# Patient Record
Sex: Female | Born: 1945 | Race: Black or African American | Hispanic: No | Smoking: Never smoker
Health system: Southern US, Community
[De-identification: ages and names within clinical notes are randomized; demographics above are authoritative.]

## PROBLEM LIST (undated history)

## (undated) DIAGNOSIS — G43909 Migraine, unspecified, not intractable, without status migrainosus: Secondary | ICD-10-CM

## (undated) DIAGNOSIS — M1990 Unspecified osteoarthritis, unspecified site: Secondary | ICD-10-CM

## (undated) DIAGNOSIS — J189 Pneumonia, unspecified organism: Secondary | ICD-10-CM

## (undated) DIAGNOSIS — J45909 Unspecified asthma, uncomplicated: Secondary | ICD-10-CM

## (undated) DIAGNOSIS — I509 Heart failure, unspecified: Secondary | ICD-10-CM

## (undated) DIAGNOSIS — I10 Essential (primary) hypertension: Secondary | ICD-10-CM

## (undated) DIAGNOSIS — M797 Fibromyalgia: Secondary | ICD-10-CM

## (undated) HISTORY — DX: Migraine, unspecified, not intractable, without status migrainosus: G43.909

## (undated) HISTORY — DX: Pneumonia, unspecified organism: J18.9

## (undated) HISTORY — DX: Unspecified osteoarthritis, unspecified site: M19.90

---

## 1968-11-06 HISTORY — PX: HERNIA REPAIR: SHX51

## 1998-07-14 ENCOUNTER — Emergency Department (HOSPITAL_COMMUNITY): Admission: EM | Admit: 1998-07-14 | Discharge: 1998-07-14 | Payer: Self-pay | Admitting: Family Medicine

## 1998-09-02 ENCOUNTER — Emergency Department (HOSPITAL_COMMUNITY): Admission: EM | Admit: 1998-09-02 | Discharge: 1998-09-02 | Payer: Self-pay | Admitting: Emergency Medicine

## 1998-09-04 ENCOUNTER — Emergency Department (HOSPITAL_COMMUNITY): Admission: EM | Admit: 1998-09-04 | Discharge: 1998-09-04 | Payer: Self-pay | Admitting: Emergency Medicine

## 1999-01-29 ENCOUNTER — Emergency Department (HOSPITAL_COMMUNITY): Admission: EM | Admit: 1999-01-29 | Discharge: 1999-01-30 | Payer: Self-pay | Admitting: Emergency Medicine

## 1999-01-30 ENCOUNTER — Encounter: Payer: Self-pay | Admitting: Emergency Medicine

## 1999-04-03 ENCOUNTER — Emergency Department (HOSPITAL_COMMUNITY): Admission: EM | Admit: 1999-04-03 | Discharge: 1999-04-03 | Payer: Self-pay | Admitting: Emergency Medicine

## 1999-04-04 ENCOUNTER — Emergency Department (HOSPITAL_COMMUNITY): Admission: EM | Admit: 1999-04-04 | Discharge: 1999-04-04 | Payer: Self-pay | Admitting: Emergency Medicine

## 2000-05-31 ENCOUNTER — Emergency Department (HOSPITAL_COMMUNITY): Admission: EM | Admit: 2000-05-31 | Discharge: 2000-05-31 | Payer: Self-pay | Admitting: Emergency Medicine

## 2000-05-31 ENCOUNTER — Encounter: Payer: Self-pay | Admitting: Emergency Medicine

## 2000-08-01 ENCOUNTER — Emergency Department (HOSPITAL_COMMUNITY): Admission: EM | Admit: 2000-08-01 | Discharge: 2000-08-01 | Payer: Self-pay | Admitting: Emergency Medicine

## 2000-11-27 ENCOUNTER — Emergency Department (HOSPITAL_COMMUNITY): Admission: EM | Admit: 2000-11-27 | Discharge: 2000-11-27 | Payer: Self-pay | Admitting: Emergency Medicine

## 2000-11-27 ENCOUNTER — Encounter: Payer: Self-pay | Admitting: Emergency Medicine

## 2003-04-22 ENCOUNTER — Encounter: Admission: RE | Admit: 2003-04-22 | Discharge: 2003-04-22 | Payer: Self-pay | Admitting: Internal Medicine

## 2003-04-22 ENCOUNTER — Encounter: Payer: Self-pay | Admitting: Internal Medicine

## 2004-04-19 ENCOUNTER — Encounter: Admission: RE | Admit: 2004-04-19 | Discharge: 2004-04-19 | Payer: Self-pay | Admitting: Internal Medicine

## 2004-11-21 ENCOUNTER — Ambulatory Visit (HOSPITAL_COMMUNITY): Admission: RE | Admit: 2004-11-21 | Discharge: 2004-11-21 | Payer: Self-pay | Admitting: Otolaryngology

## 2004-11-21 ENCOUNTER — Encounter (INDEPENDENT_AMBULATORY_CARE_PROVIDER_SITE_OTHER): Payer: Self-pay | Admitting: *Deleted

## 2004-11-21 ENCOUNTER — Ambulatory Visit (HOSPITAL_BASED_OUTPATIENT_CLINIC_OR_DEPARTMENT_OTHER): Admission: RE | Admit: 2004-11-21 | Discharge: 2004-11-21 | Payer: Self-pay | Admitting: Otolaryngology

## 2006-01-03 ENCOUNTER — Encounter: Admission: RE | Admit: 2006-01-03 | Discharge: 2006-01-03 | Payer: Self-pay | Admitting: Internal Medicine

## 2006-11-09 ENCOUNTER — Ambulatory Visit (HOSPITAL_COMMUNITY): Admission: RE | Admit: 2006-11-09 | Discharge: 2006-11-09 | Payer: Self-pay | Admitting: Internal Medicine

## 2006-11-14 ENCOUNTER — Encounter (INDEPENDENT_AMBULATORY_CARE_PROVIDER_SITE_OTHER): Payer: Self-pay | Admitting: Specialist

## 2006-11-14 ENCOUNTER — Ambulatory Visit (HOSPITAL_COMMUNITY): Admission: RE | Admit: 2006-11-14 | Discharge: 2006-11-15 | Payer: Self-pay | Admitting: Otolaryngology

## 2007-08-13 ENCOUNTER — Emergency Department (HOSPITAL_COMMUNITY): Admission: EM | Admit: 2007-08-13 | Discharge: 2007-08-13 | Payer: Self-pay | Admitting: Emergency Medicine

## 2008-01-28 ENCOUNTER — Ambulatory Visit: Payer: Self-pay | Admitting: Internal Medicine

## 2008-01-28 ENCOUNTER — Inpatient Hospital Stay (HOSPITAL_COMMUNITY): Admission: AD | Admit: 2008-01-28 | Discharge: 2008-02-01 | Payer: Self-pay | Admitting: Internal Medicine

## 2008-01-30 ENCOUNTER — Encounter (INDEPENDENT_AMBULATORY_CARE_PROVIDER_SITE_OTHER): Payer: Self-pay | Admitting: Internal Medicine

## 2008-02-06 DIAGNOSIS — I10 Essential (primary) hypertension: Secondary | ICD-10-CM | POA: Insufficient documentation

## 2008-04-29 ENCOUNTER — Encounter: Payer: Self-pay | Admitting: Internal Medicine

## 2008-04-29 ENCOUNTER — Ambulatory Visit: Payer: Self-pay | Admitting: Internal Medicine

## 2008-04-29 DIAGNOSIS — J8409 Other alveolar and parieto-alveolar conditions: Secondary | ICD-10-CM | POA: Insufficient documentation

## 2008-05-01 ENCOUNTER — Ambulatory Visit: Payer: Self-pay | Admitting: Internal Medicine

## 2008-05-01 ENCOUNTER — Ambulatory Visit: Payer: Self-pay | Admitting: Cardiology

## 2008-05-01 LAB — CONVERTED CEMR LAB
Basophils Absolute: 0.1 10*3/uL (ref 0.0–0.1)
Basophils Relative: 1 % (ref 0–1)
Calcium: 8.6 mg/dL (ref 8.4–10.5)
Eosinophils Relative: 28 % — ABNORMAL HIGH (ref 0–5)
INR: 1.1 (ref 0.0–1.5)
MCHC: 30.3 g/dL (ref 30.0–36.0)
MCV: 94.9 fL (ref 78.0–100.0)
Neutrophils Relative %: 34 % — ABNORMAL LOW (ref 43–77)
Platelets: 816 10*3/uL — ABNORMAL HIGH (ref 150–400)
Prothrombin Time: 14.4 s (ref 11.6–15.2)
RBC: 3.76 M/uL — ABNORMAL LOW (ref 3.87–5.11)
Sodium: 138 meq/L (ref 135–145)
WBC: 9.4 10*3/uL (ref 4.0–10.5)
aPTT: 36 s (ref 24–37)

## 2008-05-05 ENCOUNTER — Telehealth: Payer: Self-pay | Admitting: Internal Medicine

## 2008-05-13 ENCOUNTER — Ambulatory Visit: Admission: RE | Admit: 2008-05-13 | Discharge: 2008-05-13 | Payer: Self-pay | Admitting: Internal Medicine

## 2008-05-13 ENCOUNTER — Ambulatory Visit: Payer: Self-pay | Admitting: Internal Medicine

## 2008-05-13 ENCOUNTER — Encounter: Payer: Self-pay | Admitting: Internal Medicine

## 2008-05-14 ENCOUNTER — Telehealth: Payer: Self-pay | Admitting: Internal Medicine

## 2008-05-19 ENCOUNTER — Ambulatory Visit: Payer: Self-pay | Admitting: Internal Medicine

## 2008-05-19 LAB — CONVERTED CEMR LAB
Rhuematoid fact SerPl-aCnc: <20 intl units/mL — ABNORMAL LOW (ref 0.0–20.0)
Sed Rate: 51 mm/hr — ABNORMAL HIGH (ref 0–22)

## 2008-05-25 ENCOUNTER — Ambulatory Visit: Payer: Self-pay | Admitting: Internal Medicine

## 2008-07-14 ENCOUNTER — Telehealth: Payer: Self-pay | Admitting: Internal Medicine

## 2008-08-12 ENCOUNTER — Ambulatory Visit: Payer: Self-pay | Admitting: Internal Medicine

## 2008-08-14 ENCOUNTER — Encounter: Payer: Self-pay | Admitting: Internal Medicine

## 2008-08-14 ENCOUNTER — Telehealth: Payer: Self-pay | Admitting: Internal Medicine

## 2008-09-28 ENCOUNTER — Observation Stay (HOSPITAL_COMMUNITY): Admission: AD | Admit: 2008-09-28 | Discharge: 2008-09-29 | Payer: Self-pay | Admitting: Internal Medicine

## 2008-09-28 ENCOUNTER — Telehealth: Payer: Self-pay | Admitting: Internal Medicine

## 2009-04-22 ENCOUNTER — Emergency Department (HOSPITAL_COMMUNITY): Admission: EM | Admit: 2009-04-22 | Discharge: 2009-04-22 | Payer: Self-pay | Admitting: Emergency Medicine

## 2010-11-27 ENCOUNTER — Encounter: Payer: Self-pay | Admitting: Internal Medicine

## 2011-01-26 ENCOUNTER — Emergency Department (HOSPITAL_COMMUNITY): Payer: Medicare Other

## 2011-01-26 ENCOUNTER — Inpatient Hospital Stay (HOSPITAL_COMMUNITY)
Admission: EM | Admit: 2011-01-26 | Discharge: 2011-01-30 | DRG: 811 | Disposition: A | Discharge status: Home Health | Payer: Medicare Other | Source: Ambulatory Visit | Attending: Internal Medicine | Admitting: Internal Medicine

## 2011-01-26 DIAGNOSIS — D371 Neoplasm of uncertain behavior of stomach: Secondary | ICD-10-CM | POA: Diagnosis present

## 2011-01-26 DIAGNOSIS — M1710 Unilateral primary osteoarthritis, unspecified knee: Secondary | ICD-10-CM | POA: Diagnosis present

## 2011-01-26 DIAGNOSIS — D375 Neoplasm of uncertain behavior of rectum: Secondary | ICD-10-CM | POA: Diagnosis present

## 2011-01-26 DIAGNOSIS — K219 Gastro-esophageal reflux disease without esophagitis: Secondary | ICD-10-CM | POA: Diagnosis present

## 2011-01-26 DIAGNOSIS — Q391 Atresia of esophagus with tracheo-esophageal fistula: Secondary | ICD-10-CM

## 2011-01-26 DIAGNOSIS — I10 Essential (primary) hypertension: Secondary | ICD-10-CM | POA: Diagnosis present

## 2011-01-26 DIAGNOSIS — E785 Hyperlipidemia, unspecified: Secondary | ICD-10-CM | POA: Diagnosis present

## 2011-01-26 DIAGNOSIS — J8289 Other pulmonary eosinophilia, not elsewhere classified: Secondary | ICD-10-CM | POA: Diagnosis present

## 2011-01-26 DIAGNOSIS — D500 Iron deficiency anemia secondary to blood loss (chronic): Principal | ICD-10-CM | POA: Diagnosis present

## 2011-01-26 DIAGNOSIS — J45909 Unspecified asthma, uncomplicated: Secondary | ICD-10-CM | POA: Diagnosis present

## 2011-01-26 DIAGNOSIS — Q393 Congenital stenosis and stricture of esophagus: Secondary | ICD-10-CM

## 2011-01-26 DIAGNOSIS — B3781 Candidal esophagitis: Secondary | ICD-10-CM | POA: Diagnosis present

## 2011-01-26 DIAGNOSIS — E669 Obesity, unspecified: Secondary | ICD-10-CM | POA: Diagnosis present

## 2011-01-26 DIAGNOSIS — K449 Diaphragmatic hernia without obstruction or gangrene: Secondary | ICD-10-CM | POA: Diagnosis present

## 2011-01-26 DIAGNOSIS — R1310 Dysphagia, unspecified: Secondary | ICD-10-CM | POA: Diagnosis present

## 2011-01-26 DIAGNOSIS — Z79899 Other long term (current) drug therapy: Secondary | ICD-10-CM

## 2011-01-26 LAB — COMPREHENSIVE METABOLIC PANEL
AST: 15 U/L (ref 0–37)
Chloride: 109 mEq/L (ref 96–112)
Creatinine, Ser: 0.84 mg/dL (ref 0.4–1.2)
GFR calc non Af Amer: >60 mL/min (ref >60)
Potassium: 3.8 mEq/L (ref 3.5–5.1)
Sodium: 135 mEq/L (ref 135–145)
Total Bilirubin: 0.4 mg/dL (ref 0.3–1.2)
Total Protein: 7.9 g/dL (ref 6.0–8.3)

## 2011-01-26 LAB — URINALYSIS, ROUTINE W REFLEX MICROSCOPIC
Bilirubin Urine: NEGATIVE
Ketones, ur: NEGATIVE mg/dL
Protein, ur: NEGATIVE mg/dL
Specific Gravity, Urine: 1.01 (ref 1.005–1.030)
Urobilinogen, UA: 0.2 mg/dL (ref 0.0–1.0)

## 2011-01-26 LAB — URINE MICROSCOPIC-ADD ON

## 2011-01-26 LAB — DIFFERENTIAL
Basophils Absolute: 0 10*3/uL (ref 0.0–0.1)
Basophils Relative: 0 % (ref 0–1)
Eosinophils Absolute: 0 10*3/uL (ref 0.0–0.7)
Lymphs Abs: 2 10*3/uL (ref 0.7–4.0)
Monocytes Relative: 9 % (ref 3–12)
Neutro Abs: 6.1 10*3/uL (ref 1.7–7.7)

## 2011-01-26 LAB — CBC
MCH: 18.1 pg — ABNORMAL LOW (ref 26.0–34.0)
WBC: 8.9 10*3/uL (ref 4.0–10.5)

## 2011-01-26 LAB — PROTIME-INR
INR: 1.14 (ref 0.00–1.49)
Prothrombin Time: 14.8 seconds (ref 11.6–15.2)

## 2011-01-27 DIAGNOSIS — D649 Anemia, unspecified: Secondary | ICD-10-CM

## 2011-01-27 DIAGNOSIS — R1110 Vomiting, unspecified: Secondary | ICD-10-CM

## 2011-01-27 LAB — CBC
HCT: 29.6 % — ABNORMAL LOW (ref 36.0–46.0)
MCH: 22.7 pg — ABNORMAL LOW (ref 26.0–34.0)
MCV: 75.5 fL — ABNORMAL LOW (ref 78.0–100.0)
Platelets: 411 10*3/uL — ABNORMAL HIGH (ref 150–400)
RDW: 19.5 % — ABNORMAL HIGH (ref 11.5–15.5)
WBC: 8.5 10*3/uL (ref 4.0–10.5)

## 2011-01-27 LAB — BASIC METABOLIC PANEL
BUN: 6 mg/dL (ref 6–23)
Chloride: 107 mEq/L (ref 96–112)
Creatinine, Ser: 0.68 mg/dL (ref 0.4–1.2)
GFR calc non Af Amer: >60 mL/min (ref >60)
Glucose, Bld: 88 mg/dL (ref 70–99)
Potassium: 3.6 mEq/L (ref 3.5–5.1)

## 2011-01-27 LAB — FERRITIN: Ferritin: 4 ng/mL — ABNORMAL LOW (ref 10–291)

## 2011-01-27 LAB — IRON AND TIBC: Iron: <10 ug/dL — ABNORMAL LOW (ref 42–135)

## 2011-01-27 LAB — VITAMIN B12: Vitamin B-12: 226 pg/mL (ref 211–911)

## 2011-01-28 ENCOUNTER — Encounter: Payer: Self-pay | Admitting: Gastroenterology

## 2011-01-28 DIAGNOSIS — R1310 Dysphagia, unspecified: Secondary | ICD-10-CM

## 2011-01-28 DIAGNOSIS — B3781 Candidal esophagitis: Secondary | ICD-10-CM

## 2011-01-28 DIAGNOSIS — K449 Diaphragmatic hernia without obstruction or gangrene: Secondary | ICD-10-CM

## 2011-01-28 DIAGNOSIS — D509 Iron deficiency anemia, unspecified: Secondary | ICD-10-CM

## 2011-01-28 LAB — CROSSMATCH
Antibody Screen: NEGATIVE
Unit division: 0
Unit division: 0

## 2011-01-28 LAB — CBC
HCT: 29.2 % — ABNORMAL LOW (ref 36.0–46.0)
MCHC: 28.8 g/dL — ABNORMAL LOW (ref 30.0–36.0)
MCV: 76.8 fL — ABNORMAL LOW (ref 78.0–100.0)
Platelets: 389 10*3/uL (ref 150–400)
RDW: 19.9 % — ABNORMAL HIGH (ref 11.5–15.5)
WBC: 7.4 10*3/uL (ref 4.0–10.5)

## 2011-01-29 ENCOUNTER — Other Ambulatory Visit: Payer: Self-pay | Admitting: Gastroenterology

## 2011-01-29 DIAGNOSIS — D509 Iron deficiency anemia, unspecified: Secondary | ICD-10-CM

## 2011-01-29 DIAGNOSIS — D126 Benign neoplasm of colon, unspecified: Secondary | ICD-10-CM

## 2011-01-29 LAB — CBC
MCH: 22 pg — ABNORMAL LOW (ref 26.0–34.0)
MCV: 77.5 fL — ABNORMAL LOW (ref 78.0–100.0)
Platelets: 440 10*3/uL — ABNORMAL HIGH (ref 150–400)
RDW: 20.3 % — ABNORMAL HIGH (ref 11.5–15.5)

## 2011-01-30 LAB — TISSUE TRANSGLUTAMINASE, IGA: Tissue Transglutaminase Ab, IgA: 6.2 U/mL (ref <20)

## 2011-01-31 NOTE — H&P (Signed)
Shirley Schneider, Shirley Schneider              ACCOUNT NO.:  [id number]  MEDICAL RECORD NO.:  [id number]           PATIENT TYPE:  LOCATION:                                 FACILITY:  PHYSICIAN:  Jeoffrey Massed, MD    DATE OF BIRTH:  1946/09/03  DATE OF ADMISSION:  01/26/2011 DATE OF DISCHARGE:                             HISTORY & PHYSICAL   PRIMARY CARE PRACTITIONER:  Fleet Contras, MD.  CHIEF COMPLAINT:  Refer to the ED from her primary care's office for possible anemia.  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  Per patient for the past 1 month, she has been having epigastric pain, burning in nature.  She thinks this is a flare of usual gastroesophageal reflux disease.  She also has some regurgitation and along with that apparently has been vomiting at least daily on a regular basis for a month.  She denies any hematemesis, hematochezia, or melena. She also claims to have worsening fatigue and malaise, and apparently has an unquantified amount of exertional dyspnea.  This patient has chronic osteoarthritis of her knee and shoulders, but the most worse is on her left knee.  She was ambulatory to the week ago with the help of a walker; however, for the past week or 10 days, the patient has been having trouble ambulating especially on the left leg because of the pain in the knee.  In the past, the patient has had numerous visits to her primary orthopedic office for intra-articular steroid injections.  She denies any headache, chest pain.  Denies any palpitations.  Denies any ongoing nausea or vomiting currently.  Denies any diarrhea.  Upon repeatedly asking, she denies that her  stool has ever been black in color or any blood in the stools that she has noticed.  Denies any dysuria.  ALLERGIES:  She claims she is allergic to ASPIRIN.  PAST MEDICAL HISTORY: 1. Hypertension. 2. Obesity. 3. Osteoarthritis. 4. Chronic eosinophilic pneumonia, diagnosed in 2009, and started on     prednisone which she was taken off for the past few months, she is     not exactly sure. 5. History of asthma. 6. Questionable dyslipidemia.  PAST SURGICAL HISTORY:  She has had a hernia surgery.  MEDICATIONS AT HOME: 1. Toprol-XL 50 mg p.o. daily. 2. She takes Soma at an unknown dose. 3. She takes Zanaflex at an unknown dose. 4. She takes some hydrocodone for osteoarthritis at an unknown dose.  FAMILY HISTORY:  Her mother has history of CVA.  SOCIAL HISTORY:  She denies any toxic habits and lives with her daughter.  REVIEW OF SYSTEMS:  A detailed review of 12 systems was done and these are negative, except for the ones mentioned in the HPI.  PHYSICAL EXAM:  VITAL SIGNS: Afebrile, heart rate of 65, blood pressure 120/61, respiration of 20, pulse ox of 100%  on room air. GENERAL:  Lying in bed, does not appear to be in any distress.  Easily able to speak in full sentences and participating in the history taking process.  Alert and awake. HEENT:  Atraumatic, normocephalic.  Pupils are equally reactive to light and accommodation.  Oral mucosa is moist. NECK:  Supple.  No JVD. CHEST:  Bilaterally clear to auscultation. CARDIOVASCULAR:  Heart sounds are regular.  No murmurs heard. ABDOMEN:  There is an upper midline scar.  The rest of her belly is soft, nontender, nondistended.  She is obese. EXTREMITIES:  There is no edema.  Her bilateral knees are not swollen or erythematous.  Her left knee has some mild tenderness on palpation. Again, it is not swollen on exam.  She has strong bilateral pedal pulses with good capillary refill and both her lower extremities are warm  to touch. SKIN:  No rash. NEUROLOGIC:  The patient is alert, awake, and has no focal neurological deficits.  LABORATORY DATA: 1. CBC shows a WBC of 8.9, hemoglobin of 5.4, hematocrit of 21.3, MCV     of 71.5, and a platelet count of 508. 2. Electrolytes show a sodium of 135, potassium of 3.8, chloride of     109, bicarb of 21, glucose of 87, BUN of 9, creatinine of 0.84. 3. Alkaline phosphatase is 213, total bilirubin is 0.4, AST is 15, ALT     is 9, calcium of 8.7, total protein of 7.9, and albumin of 4.4. 4. INR is 1.14.  RADIOLOGICAL STUDIES:  Acute abdominal series with abdominal two views and chest one view shows no acute abnormality of the chest or abdomen. Small hiatal hernia.  Low lung volumes.  ASSESSMENT: 1. Severe anemia likely microcytic.  The patient does not remember if     she has ever had a GI workup in the past.  Given the microcytic     anemia, obviously iron-deficiency anemia is of concern.  She will     likely need GI workup. 2. History of hypertension, currently well controlled. 3. History of osteoarthritis, particularly worse in her left knee.  We     will get x-rays to further evaluate and she may further in need of     Orthopedic assessment including for an intra-articular steroid     injection as she has difficulty ambulating with that leg. 4. History of chronic eosinophilic pneumonia, previously was on     steroid.  The patient is not sure when she stopped her steroid, but     claims this was a few months ago.  PLAN: 1. This patient will be admitted to telemetry unit. 2. She will be transfused with 3 units of blood after anemia panel has     been obtained. 3. She will be put on a proton pump inhibitor twice daily and given     antiemetics. 4. She will be given clear liquid diet for tonight, and after 7 a.m.,     she will be put n.p.o. for possible endoscopy.  I have spoken to     Dr. Leone Payor, [facility], and they will evaluate in      the morning as well. 5. Please note that the ED physician did do a rectal exam.  However,     he did not get any stool as the rectal vault was empty. 6. The patient will be observed and monitored very closely.     Currently, there are no signs or symptoms for history or an exam  of     any acute GI loss and we will continue to monitor closely.  Further     plans will be determined as the patient's clinical course evolves     and further sub-specialization input is obtained. 7. DVT prophylaxis will be done by bilateral SCDs.  We will avoid     heparin products given the degree of anemia and possibility of     occult bleeding. 8. We will obtain x-rays of her left hip, and if she continues to have     difficulty ambulating with the left leg, may be an Orthopedic     consultation will be warranted. 9. Physical therapy evaluation will also be obtained.  CODE STATUS:  The patient is a full code.  TOTAL TIME SPENT:  45 minutes.     Jeoffrey Massed, MD     SG/MEDQ  D:  01/26/2011  T:  01/26/2011  Job:  [id number]  cc:   Fleet Contras, M.D.  Electronically Signed by Jeoffrey Massed  on 01/31/2011 05:04:12 PM

## 2011-02-01 ENCOUNTER — Encounter: Payer: Self-pay | Admitting: Gastroenterology

## 2011-02-02 NOTE — Procedures (Signed)
Summary: Colonoscopy  Patient: [name] Note: All result statuses are Final unless otherwise noted.  Tests: (1) Colonoscopy (COL)   COL Colonoscopy           DONE     [place]     [address]     [place], [place]  [address]          COLONOSCOPY PROCEDURE REPORT          PATIENT:  Shirley Schneider, Shirley Schneider  MR#:  [id number]     BIRTHDATE:  09/22/1946, 65 yrs. old  GENDER:  female     ENDOSCOPIST:  Rachael Fee, MD     PROCEDURE DATE:  01/28/2011     PROCEDURE:  Incomplete colonoscopy     ASA CLASS:  Class II     INDICATIONS:  severe IDA, FOBT negative     MEDICATIONS:  Fentanyl 100 mcg IV, Versed 8 mg IV          DESCRIPTION OF PROCEDURE:   After the risks benefits and     alternatives of the procedure were thoroughly explained, informed     consent was obtained.  Digital rectal exam was performed and     revealed no rectal masses.   The EC-3890Li ([id number]) endoscope was     introduced through the anus and advanced to the sigmoid colon,     limited by poor preparation.    The quality of the prep was poor,     using MoviPrep.  The instrument was then slowly withdrawn as the     colon was fully examined.     <<PROCEDUREIMAGES>>     FINDINGS:  The prep was not adequate to allow appropriate     inspection of the mucosa. see image002, image003, and image004).     Retroflexed views in the rectum revealed not done.    The scope was     then withdrawn from the patient and the procedure     completed.COMPLICATIONS:  None          ENDOSCOPIC IMPRESSION:     1) Extremely poor prep.  Examination was incomplete due to poor     prep          RECOMMENDATIONS:     Will re-try prep today. It does not appear that she complete any     of the prep from the examination today. Will need staff to help     her this time.     Will proceed with EGD now.          ______________________________     Rachael Fee, MD          n.     eSIGNED:   Rachael Fee  at 01/28/2011 09:03 AM          [name], [id number]  Note: An exclamation mark (!) indicates a result that was not dispersed into the flowsheet. Document Creation Date: 01/28/2011 9:04 AM _______________________________________________________________________  (1) Order result status: Final Collection or observation date-time: 01/28/2011 08:54 Requested date-time:  Receipt date-time:  Reported date-time:  Referring Physician:   Ordering Physician: Rob Bunting [phone]) Specimen Source:  Source: Launa Grill Order Number: [id number] Lab site:

## 2011-02-02 NOTE — Procedures (Signed)
Summary: Upper Endoscopy  Patient: Shirley Schneider Note: All result statuses are Final unless otherwise noted.  Tests: (1) Upper Endoscopy (EGD)   EGD Upper Endoscopy       DONE     [facility]     [address]     [place], [place]  [address]          ENDOSCOPY PROCEDURE REPORT          PATIENT:  [name], [name]  MR#:  [id number]     BIRTHDATE:  August 25, 1946, 65 yrs. old  GENDER:  female     ENDOSCOPIST:  Rachael Fee, MD     PROCEDURE DATE:  01/28/2011     PROCEDURE:  EGD, diagnostic [phone]     ASA CLASS:  Class II     INDICATIONS:  severe IDA, regurge, dysphagia (colonoscopy just     prior to this exam was incomplete due to very poor prep)     MEDICATIONS:  There was residual sedation effect present from     prior procedure, Fentanyl 25 mcg IV, Versed 4.5 mg IV     TOPICAL ANESTHETIC:  Cetacaine Spray          DESCRIPTION OF PROCEDURE:   After the risks benefits and     alternatives of the procedure were thoroughly explained, informed     consent was obtained.  The Pentax Gastroscope [id number] endoscope     was introduced through the mouth and advanced to the second     portion of the duodenum, without limitations.  The instrument was     slowly withdrawn as the mucosa was fully examined.     <<PROCEDUREIMAGES>>     There were numerous yellow, whitish exudative plaques in     esophagus. This was consistent with fungal infection (see image001     and image010).  A Schatzki's ring was found. This was not dilated     (see image002).  A hiatal hernia was found. This was 4cm (see     image003 and image008).  Otherwise the examination was normal (see     image006, image005, and image007).    Retroflexed views revealed     no abnormalities.    The scope was then withdrawn from the patient     and the procedure completed.          COMPLICATIONS:  None          ENDOSCOPIC IMPRESSION:     1) Candida esophagitis     2) Schatzki's ring, not dilated     3)  Medium sized hiatal hernia     4) Otherwise normal examination          The UGI symptoms are multifactorial (medium sized hernia, yeast     infection in esophagus and Schatzki's ring).          RECOMMENDATIONS:     Will start diflucan 200mg  today, then 100mg  a day for nine more     days.     She should stay on once daily PPI.     If dysphagia persists despite antifungal treatment, will repeat     EGD and dilate the Schatzki's ring.  Should chew your food well     and eat slowly.          ______________________________     Rachael Fee, MD          n.     eSIGNED:   Rachael Fee  at 01/28/2011 09:11 AM          [name], [id number]  Note: An exclamation mark (!) indicates a result that was not dispersed into the flowsheet. Document Creation Date: 01/28/2011 9:12 AM _______________________________________________________________________  (1) Order result status: Final Collection or observation date-time: 01/28/2011 09:04 Requested date-time:  Receipt date-time:  Reported date-time:  Referring Physician:   Ordering Physician: Rob Bunting [phone]) Specimen Source:  Source: Launa Grill Order Number: [id number] Lab site:

## 2011-02-07 NOTE — Procedures (Signed)
Summary: Colonoscopy  Patient: Shirley Schneider Note: All result statuses are Final unless otherwise noted.  Tests: (1) Colonoscopy (COL)   COL Colonoscopy           DONE     [facility]     [address]     [place], [place]  [address]          COLONOSCOPY PROCEDURE REPORT          PATIENT:  Shirley, Schneider  MR#:  [id number]     BIRTHDATE:  1946/01/01, 65 yrs. old  GENDER:  female     ENDOSCOPIST:  Rachael Fee, MD     PROCEDURE DATE:  01/29/2011     PROCEDURE:  Colonoscopy with snare polypectomy     ASA CLASS:  Class II     INDICATIONS:  IDA, hemoccult negative     MEDICATIONS:   Versed 10 mg IV, Fentanyl 125 mcg IV, Benadryl 50     mg IV          DESCRIPTION OF PROCEDURE:   After the risks benefits and     alternatives of the procedure were thoroughly explained, informed     consent was obtained.  Digital rectal exam was performed and     revealed no rectal masses.   The EC-3890Li ([id number]) endoscope was     introduced through the anus and advanced to the cecum, which was     identified by both the appendix and ileocecal valve, without     limitations.  The quality of the prep was good, using MoviPrep.     The instrument was then slowly withdrawn as the colon was fully     examined.     <<PROCEDUREIMAGES>>     FINDINGS:  A sessile polyp was found in the descending colon. This     was 4mm across, removed with cold snare and sent to pathology (jar     1) (see image003).  This was otherwise a normal examination of the     colon (see image001, image002, and image004).   Retroflexed views     in the rectum revealed no abnormalities.    The scope was then     withdrawn from the patient and the procedure completed.     COMPLICATIONS:  None          ENDOSCOPIC IMPRESSION:     1) Small sessile polyp in the descending colon; removed and sent     to pathology     2) Otherwise normal examination; this examination does not     explain her severe IDA.  FOBT  has been negative twice and so it is     not clear that anemia is from a GI process.  Usual post     polypectomy bleeding may cause false positive FOB tests for next     1-2 weeks.          RECOMMENDATIONS:     1) If the polyp(s) removed today are proven to be adenomatous     (pre-cancerous) polyps, you will need a repeat colonoscopy in 5     years. Otherwise you should continue to follow colorectal cancer     screening guidelines for "routine risk" patients with colonoscopy     in 10 years.     2) You will receive a letter within 1-2 weeks with the results     of your biopsy as well as final recommendations. Please call my  office if you have not received a letter after 3 weeks.     3) Iron supplement daiy.          ______________________________     Rachael Fee, MD          n.     eSIGNED:   Rachael Fee at 01/29/2011 09:00 AM          [name], [id number]  Note: An exclamation mark (!) indicates a result that was not dispersed into the flowsheet. Document Creation Date: 01/29/2011 9:01 AM _______________________________________________________________________  (1) Order result status: Final Collection or observation date-time: 01/29/2011 08:55 Requested date-time:  Receipt date-time:  Reported date-time:  Referring Physician:   Ordering Physician: Rob Bunting [phone]) Specimen Source:  Source: Launa Grill Order Number: [id number] Lab site:

## 2011-02-22 NOTE — Discharge Summary (Signed)
Shirley Schneider, Shirley Schneider              ACCOUNT NO.:  [id number]  MEDICAL RECORD NO.:  [id number]           PATIENT TYPE:  I  LOCATION:  6733                         FACILITY:  [facility]  PHYSICIAN:  Clydia Llano, MD       DATE OF BIRTH:  1946/08/12  DATE OF ADMISSION:  01/26/2011 DATE OF DISCHARGE:  01/30/2011                              DISCHARGE SUMMARY   PRIMARY CARE PHYSICIAN:  Fleet Contras, MD  REASON FOR ADMISSION:  Directed from primary care physician because of anemia.  DISCHARGE DIAGNOSES: 1. Profound iron-deficiency anemia. 2. Esophageal candidiasis. 3. Hypertension. 4. Osteoarthritis. 5. Obesity. 6. Chronic eosinophilic pneumonia. 7. Asthma. 8. History of dyslipidemia. 9. Small one sessile polyp. 10.Schatzki ring.  DISCHARGE MEDICATIONS: 1. Fluconazole 100 mg p.o. daily, take for 8 more days. 2. Nu-Iron 150 mg p.o. at bedtime. 3. Clorazepate 7.5 mg twice daily as needed for anxiety. 4. Hydrocodone/APAP 10/500 mg 1 tablet p.o. every 6 hours needed for     pain. 5. Lidocaine patch 5% 1-2 patches transdermally q.24 hours as needed     for pain, apply to skin and leave for 12 hours, then remove. 6. Nexium 40 mg p.o. daily. 7. ProAir 90 mcg inhaler 1-2 puffs inhaled 4 times daily as needed for     wheezing or shortness of breath. 8. Promethazine 25 mg every 8 hours as needed for nausea. 9. Promethazine rectal suppository 25 mg rectally 3 times daily as     needed for nausea. 10.Soma 350 mg 1 tablet every 8 hours as needed for pain. 11.Symbicort 160/4.5 mcg 2 puffs inhaled twice daily. 12.Toprol-XL 50 mg p.o. daily. 13.Zanaflex 4 mg at bedtime as p.r.n. muscle relaxant.  RADIOLOGY: 1. Hip complete x-rays showed negative exam. 2. Acute abdomen series showed no acute abnormality of chest or     abdomen.  PROCEDURES: 1. EGD on March 24 showed:     a.     Candida esophagitis.     b.     Schatzki ring, not dilated.     c.     Medium-sized hiatal hernia, otherwise  normal examination. 2. Colonoscopy done on March 24 with bad preparation, repeated on     March 25.  The repeat exam showed small sessile polyp in the     descending colon, removed and sent to pathology, otherwise normal     examination.  This small polyp should not be probably causing the     anemia.  Pathology will be sent to the patient from Dr. Christella Hartigan'     office. 3. Blood transfusion of 3 units.  CONSULTS:  Dr. Rob Bunting, [facility].  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  For full H and P details, please see Dr. Windell Norfolk dictated note on March 22.  The patient apparently was complaining about recent epigastric pain, burning in nature and the patient denies any hematemesis, hematochezia, or melena.  She went to be evaluated by her primary care physician and she was found to be anemic, so  she was sent to the hospital for further evaluation.  The patient also has difficulty with ambulation because of osteoarthritis.  The patient is following with her orthopedist for intra- articular steroid injection.  Upon admission, the patient's hemoglobin was 5.4, so the patient was admitted for further evaluation. 1. Iron-deficiency anemia.  The patient has microcytic iron-deficiency     anemia, probably secondary to chronic iron deficiency and chronic     blood loss.  The patient did have negative fecal occult blood     testing when she came in but because of the profound anemia and the     symptoms, the patient underwent EGD and colonoscopy which were     within normal limits except for Candida esophagitis.  EGD and     colonoscopy showed no evidence of active source of bleeding.  The     patient has a small polyp which was removed which was not bleeding.     Dr. Christella Hartigan recommend to continue on the PPI daily.  Follow up  with     him if needed. 2. Iron deficiency.  The anemia panel showed iron of less than 10 and     ferritin less than 4.  The patient was given 1500 mg of elemental     iron through IV iron dextran.  Also, the patient was started on Nu-     Iron complex once daily at night time.  The patient said iron     supplements may cause upset of stomach, so she was asked to take it     at nighttime.  On the day of discharge, the patient hemoglobin was     9.1 and hematocrit 32.0. 3. Osteoarthritis and left lower extremity pain.  The patient was seen     by physical therapy and occupational therapy, was recommended home     health physical therapy and continues with the rolling walker.  The     patient does have orthopedist to follow up with. 4. Hypertension.  The patient's blood pressures are well controlled.     The patient is on amlodipine.  The patient's blood pressure was in     the high side.  When she came in, it was well controlled and then     started to go up a little bit probably because of the multiple     transfusion and IV fluids.  The patient is on Toprol-XL 50 mg p.o.     daily.  The patient required amlodipine during the hospital stay     but that was not prescribed at the time of discharge.  I     recommended to check with her primary care physician.  If blood     pressure is still high, she might benefit from high dose of     Norvasc.  DISCHARGE SITUATION:  The patient is alert.  DISCHARGE CONDITION:  Stable.  VITALS SIGNS:  Temperature is 98.5, pulse 73, respirations 19, blood pressure is 139/86, and O2 saturations 98% on room air.  INSTRUCTIONS: 1. Activity as tolerated. 2. Diet; heart-healthy diet.  DISPOSITION:  Home with home health service.     Clydia Llano, MD     ME/MEDQ  D:  01/30/2011  T:  01/31/2011  Job:  [id number]  cc:   Fleet Contras, M.D. Rachael Fee, MD  Electronically Signed by Clydia Llano  on 02/22/2011 03:40:19 PM

## 2011-03-21 NOTE — Op Note (Signed)
[name], Schneider              ACCOUNT NO.:  [id number]   MEDICAL RECORD NO.:  [id number]          PATIENT TYPE:  INP   LOCATION:  3028                         FACILITY:  [facility]   PHYSICIAN:  Kalman Shan, MD   DATE OF BIRTH:  09-Dec-1945   DATE OF PROCEDURE:  01/31/2008  DATE OF DISCHARGE:  02/01/2008                               OPERATIVE REPORT   PLANNED PROCEDURE:  Bronchoscopy with right middle lobe lavage and right  lower lobe transbronchial biopsy.   INDICATION:  Possible sarcoidosis.   PRE-PROCEDURE ASSESSMENT:  Shirley Schneider was taken to the bronchoscopy  suite this morning on January 31, 2008, for evaluation of possible  sarcoidosis. CT scan of the chest from [facility] showed subcarinal  lymphadenopathy that was significant in size, but when compared to the  CT scan from [facility] that was done in 2005 the CT scan this is  unchanged and stable subcarinal lymphadenopathy. However, current CT  scan shows worsening infiltrates.   She also had CT scan of the abdomen and pelvis. This was compared to the  prior CT scan of the abdomen and pelvis and showed stable  lymphadenopathy except for some new 1cm pelvic lymph nodes that Dr.  Claudie Schneider is going to follow.   As part of pre-procedure evaluation:  1. She had pulmonary function tests. FEV1 was 1.35 liters/60%. Normal      RAtio. Cleda Daub was mixed picture. DLCO was 13.87 and 57% of predicted      showing diffused diffusion capacity consistent with alveolar      filling defect.   1. A six-minute walk test:  Formal result is pending. She apparently      walked only 150 feet for over 2 minutes and stopped because of hip      pain. Her saturation dropped to 91%.   LABORATORY TESTS:  January 31, 2008, her phosphorus and magnesium were  normal. Cardiac panel on January 31, 2008 showed a normal troponin of  0.02.   BMET showed sodium 139, potassium 3.6, chloride 109, bicarb 20, glucose  87, BUN 5, creatinine 0.67,  calcium normal at 8.9.   LFTs were all within normal limits with an albumin of 3.   Coagulation PTT was 33% and normal. INR was 1.1 and normal.   Complete blood count showed white count of 69.7, hemoglobin 10 that is  stable since 2008, and platelet count of 718,000.   Infectious disease workup was urine legionella  antigen was negative.   Anemia workup showed normal folic acid, normal B12, and normal iron  panel results.   BNP  on January 30, 2008 was slightly elevated at 142.   Echocardiogram on January 30, 2008, showed left ventricular ejection  fraction of 70%. There is mild mitral valvular regurgitation. There were  no other abnormalities. The right ventricular function and size were  normal. Pulmonary artery pressures were felt to be normal.   Overall, she was felt to be stable and fit for bronchoscopy.   IMMEDIATE PRE-PROCEDURE ASSESSMENT:  In the bronchoscopy suite, she did  come on an empty stomach, but the blood pressure  was 170/115 on multiple  occasions. Her morning meds that were due at 10 o'clock were then  administered at 8 a.m. She was observed for a further 1 hour, and  despite this the blood pressure continued to be high at 170/115.  Therefore, bronchoscopy was cancelled. She was sent back to the floor.  She was again later evaluated around noon. Her blood pressure at this  time was 140/90. However, it was felt because of scheduling issues and  safety of bronchoscopy that bronchoscopy would be cancelled on January 31, 2008.   POST-PROCEDURE PLAN:  1. Cancelled bronchoscopy.  2. From my perspective she can go home.  3. Followup outpatient clinic with Dr. Marchelle Schneider on February 05, 2008, at      10:40 a.m. at [facility]. Telephone number and name and      address given.  4. Patient to bring her CD-ROM from [facility] done on January 28, 2008.  5. When she comes to followup we will make re-assessment about      bronchoscopy.   At bronchoscopy the plan  is to do right middle lobe lavage for CD8, CD4,  and D-dimer and cell count to help establish diagnosis of sarcoid and  also to do transbronchial biopsies of the right lower lobe and consider  a subcarinal needle aspiration.   NOTE: at time of my editing this dictation on 02/10/2008 patient did not  show up for followup. I called our scheduler Shirley Schneider and she did not  see patient in the system for an appointment. She is going to call  patient and make a new appt.      Kalman Shan, MD  Electronically Signed     MR/MEDQ  D:  01/31/2008  T:  01/31/2008  Job:  [id number]

## 2011-03-21 NOTE — Consult Note (Signed)
NAMECLARKE, AMBURN              ACCOUNT NO.:  [id number]   MEDICAL RECORD NO.:  [id number]          PATIENT TYPE:  INP   LOCATION:  4702                         FACILITY:  [facility]   PHYSICIAN:  Kalman Shan, MD   DATE OF BIRTH:  [birth year]   DATE OF CONSULTATION:  01/29/2008  DATE OF DISCHARGE:                                 CONSULTATION   PULMONARY CONSULTATION:  Forty minute inpatient consult between 12:05  and 12:45 p.m.   CONSULT REQUESTED BY:  Fleet Contras, M.D.   REASON FOR CONSULTATION:  Pulmonary infiltrates.  Please evaluate.   HISTORY OF PRESENT ILLNESS:  Kamorie Aldous is a pleasant, 65 year old  African American woman who is a primary care patient of Dr. Concepcion Elk for  hypertension, asthma, chronic sinusitis, and anxiety disorder.  She  reports to me that she has had a new onset cough for the past two weeks.  It is predominantly dry, although she feels that she has some phlegm  down her chest that she is unable to bring up.  It is present both  during day and night, worse when she lies down.  The frequency and  severity of cough is worsening with time.  In association with the  cough, she has had fevers and chills and sore throat for the past one  week.  The fever has been as high as 102 at home and intermittent in  nature.  She is also short of breath with exertion, activities such as  changing clothes and taking shower does make her short of breath  currently.   She denies orthopnea, paroxysmal nocturnal dyspnea, palpitations,  syncope, hemoptysis, vomiting, diarrhea or urinary symptoms.   She has had multiple visits to her Dr. Concepcion Elk with present complaints  and ultimately ended up having a chest x-ray and CT chest.  The CT chest  is not available, but I was told it has bilateral pulmonary infiltrates,  and therefore she was admitted for evaluation.   PAST MEDICAL HISTORY:  1. Systemic hypertension.  2. Gastroesophageal reflux disease under control  currently.  3. Asthma.  Asthma was diagnosed many years ago, is on Symbicort 2      puffs b.i.d. and ProAir as needed.  At baseline, it is under      control.  She requires ProAir only less than one time a week.  4. History of chronic bronchitis not otherwise specified.  5. Chronic sinusitis not otherwise specified.  6. History of multiple nasal surgeries.  Unclear if she is on nasal      steroids.  7. Nasal polyps.  8. Arthritis of lumbar spine.  9. Osteoporosis.  10.Anxiety disorder.  11.Dyslipidemia.   PAST SURGICAL HISTORY:  Has had sinus surgery twice.  Status post  abdominal hernia repair and tubal ligation.   HOME MEDICATIONS:  1. Zanaflex.  2. Albuterol.  3. Symbicort.  4. Tranxene.  5. Soma.  6. Phenergan.  7. Citrucel.  8. Actonel.  9. Bystolic.  10.Tussionex.  11.Allegra.  12.Pravastatin.  13.Bupap.   She is currently on Zosyn after admission.   ALLERGIES:  ASPIRIN WHICH CAUSES NAUSEA  AND VOMITING.   FAMILY HISTORY:  Father has diabetes, hypertension, stroke, thyroid  disease.  Mother is deceased.  She has children with asthma,  grandchildren with asthma.   Sick contact was one grandchild with pneumonia prior to her falling  sick.   SOCIAL HISTORY:  She is retired.  Does not use any alcohol, tobacco,  illicit drugs.   REVIEW OF SYSTEMS:  Significant for cough, fever, shortness of breath  and pulmonary infiltrates.  Significant sick contact recently.  Otherwise, see history of present illness.   PHYSICAL EXAMINATION:  VITAL SIGNS:  Temperature 98.8, pulse of 73,  respiratory rate of 20, blood pressure 147/92.  Saturation 98% on room  air.  GENERAL EXAM:  She is in an isolation room.  Seems comfortable, pleasant  female, nice to interact with.  Alert and oriented x3.  Glasgow coma  scale 15.  Moves all four extremities.  PSYCHIATRIC EXAM:  Pleasant affect.  GENERAL EXAM:  Morbidly obese.  HEENT:  Short neck, Mallampati class 2-3.  She was embarrassed  showing  her mouth because of thrush, but I did not notice any thrush.  Some  dental caries present.  Possibly elevated JVP at 2 to 3 cm in the  sitting position.  RESPIRATORY EXAM:  Trachea central.  Air entry equal on both sides.  Bilateral extensive wheeze present in expiratory phase.  In late  inspiration there are even some crackles.  CARDIOVASCULAR:  1st and 2nd heart sounds heard.  No murmurs, no  gallops.  ABDOMEN:  Obese, soft, nontender, no mass.  Bowel sounds are present.  EXTREMITIES:  No edema, no calf tenderness, no swelling.   LABORATORY EVALUATION:  1. CT scan of the chest.  This is with Dr. Concepcion Elk and he is going to      bring it in.  2. Chest x-ray January 28, 2008:  New bilateral patchy airspace disease      and nodular opacities.  May represent multifocal pneumonia, but      metastatic disease could not be excluded.  This is in comparison      with chest x-ray August 13, 2007.   Of note, she had a CT chest in April 19, 2004.  This was done for  generalized body pain and shortness of breath.  This showed multiple  small nodules less than 4 mm in size, and some lower lobe atelectasis  and slight nonspecific mediastinal adenopathy esp a 2cm subcarinal node.  There is no other subsequent CT chest.   LABORATORY VALUES:  1. LDH today, March 25, is normal at 134.  2. ABG room air:  Normal.  7.36, PCO2 - 34, PO2 - 84.  3. Urinalysis is negative.  4. BMET is essentially normal with BUN of 2 and a creatinine of 0.6.      Liver function tests are also normal.  5. CBC March 25:  White count 10.3, hemoglobin 10.1, platelet count      856.  Her platelet counts are abnormally elevated.  6. Serial hemoglobin shows a hemoglobin of 11.1 and 11.6 in October      and January 2008 respectively.  Therefore at this time, her      hemoglobin is overall stable.  MCV, mean corpuscular volume, is      normal, arguing against any microcytic anemia.   ASSESSMENT AND PLAN:   PROBLEM  LIST:  1. Normocytic anemia.  2. Diffuse parenchymal infiltrates.   Hue Steveson is a 65 year old nonsmoker with a past  medical history of  asthma, who was exposed to a grand kid with pneumonia prior to her  falling sick.  Fort the last two weeks has had progressive cough with  some fever, chills and shortness of breath.  Chest x-rays chows  bilateral diffuse parenchymal opacities.  CT scan is pending.  Of note,  CT scan in 2004 showed some micro nodules.  She also has normocytic  anemia of 10 grams at this current admission, but this seems stable  compared to her 2008 hemoglobin.  Differential diagnoses for parenchymal  infiltrates are broad and include carcinomatosis, active interstitial  pneumonitis (AIP), acute eosinophilic pneumonia (AEP), viral pneumonia,  apical pneumonia, congestive heart failure, and sarcoidosis.   PLAN:  1. I definitely want to see the CT chest done at [facility]  2. Stop Zosyn because she has had no hospitalizations.  Better drug      would be IV Avelox 400 mg once daily.  3. Check urine lesion.  Run streptococcal antigen.  4. Check RSV and FLU nasal antigen swab.  5. Agree with PPD placement.  6. Discontinue the respiratory isolation if the flu swab is negative.  7. Check BNP to rule out heart failure.   Depending on the he results of all of the above, I would consider  bronchoscopy to get at other etiologies.   Thank you for this interesting consult.      Kalman Shan, MD  Electronically Signed     MR/MEDQ  D:  01/29/2008  T:  01/29/2008  Job:  [id number]

## 2011-03-21 NOTE — H&P (Signed)
Shirley Schneider, Shirley Schneider              ACCOUNT NO.:  [id number]   MEDICAL RECORD NO.:  [id number]          PATIENT TYPE:  INP   LOCATION:  5530                         FACILITY:  [facility]   PHYSICIAN:  Fleet Contras, M.D.    DATE OF BIRTH:  [birth year]   DATE OF ADMISSION:  09/28/2008  DATE OF DISCHARGE:                              HISTORY & PHYSICAL   PRESENTING COMPLAINTS:  Cough with chest congestion.   HISTORY OF PRESENTING ILLNESS:  Shirley Schneider is a 65 year old African  American lady with past medical history significant for chronic  sinusitis, allergic rhinitis, chronic bronchitis, bronchial asthma,  systemic hypertension, and anxiety disorder.  She was directly admitted  from the office today after she complaints of 2 weeks of progressively  worsening cough, initially nonproductive but now productive of yellowish  sputum associated with feeling feverish, sweating, chills, nasal  congestion, and postnasal drip.  She had been treated about 2 weeks ago  for acute sinusitis with Zithromax, but her symptoms seem to have  recurred.  She is now feeling unwell, fatigue with weakness, and lack of  energy, as well as poor oral intake, and has no appetite to eat.  She  denies any chest pain but feels short of breath with mild exertion  associated with feeling of chest tightness and chest congestion.  She  has had no hemoptysis.  No orthopnea, PND, or palpitations.  In the  office her vital signs were stable.  She was not in any acute  respiratory or painful distress.  Her pulse source was 96% on room air.  She was not pale.  She was not icteric.  She was not cyanosed.  She had  mild congestion in her nasal passages.  She has scattered rhonchi in her  lungs.  Her chest x-ray did show a possible right middle lobe and right  lower lobe infiltrates.  In view of progressive recurrent symptoms, it  was thought to wise for her to be admitted to the hospital for  intravenous antibiotics and close  monitoring.   PAST MEDICAL HISTORY:  1. Recent hospitalization about 60-month ago for pneumonia.  2. Systemic hypertension.  3. Gastroesophageal reflux disease.  4. Bronchial asthma.  5. Chronic bronchitis.  6. Chronic sinusitis.  7. Arthritis of the lumbar spine.  8. Anxiety disorder.  9. Dyslipidemia.  10.Osteoporosis.   PAST SURGICAL HISTORY:  1. Sinus surgery x2.  2. Abdominal hernia repair.  3. Tubal ligation.   MEDICATION HISTORY:  She is on:  1. Zanaflex 4 mg nightly.  2. Albuterol HFA 2 puffs q.6 p.r.n.  3. Albuterol nebulizer 2.5 mg q.6 p.r.n.  4. Tranxene 7.5 mg b.i.d.  5. Soma 350 mg one p.o. t.i.d.  6. Phenergan 25 mg p.o. q.6 p.r.n.  7. Citracal plus vitamin D 1 p.o. b.i.d.  8. Prevacid 30 mg p.o. daily.  9. Allegra 180 mg p.o. daily.  10.Symbicort 160/4.5 mg 1 puff b.i.d.  11.Bystolic10 mg p.o. daily.  12.Toviaz 4 mg once daily.  13.Dolgic Plus 1 p.o. q.6 p.r.n.  14.Actonel 150 mg one p.o. monthly.   ALLERGIES:  She has  nausea and vomiting with ASPIRIN.   FAMILY AND SOCIAL HISTORY:  Both parents are deceased, mother from  hypertension and diabetes and father from heart disease, stroke, and  thyroid disease.  She has a child with asthma.  She does not use any  alcohol, tobacco, or illicit drugs.   REVIEW OF SYSTEMS:  Essentially as above.   PHYSICAL EXAMINATION:  She is sitting in the office chair, not in acute  respiratory or painful distress.  She is not pale.  She is not icteric.  She is not cyanosed.  She is well hydrated.  Her vital signs showed a  blood pressure of 140/84, heart rate of 81 and regular, respiratory rate  of 18, temperature 98.7, and pulse ox on room air is 96%.  She weighs in  at 229 pounds at the height of 5 feet 5 inches.  She is using two canes  to ambulate.  Her HEENT, pupils are equal and reactive to accommodation.  Her neck is supple with no elevated JVD.  No cervical lymphadenopathy.  No carotid bruit.  She has bilateral  nasal congestion with scanty  discharge, which is clear.  There are bilateral sinus, frontal, or  maxillary tenderness.  Chest shows good air entry bilaterally with few  transmitted sounds and rhonchi.  Heart sounds were not heard with no  murmurs, no S3 gallops, no clicks, or rubs.  Abdomen is obese, soft,  nontender, and no masses.  Bowel sounds are present.  Extremities shows  no edema, calf tenderness, or swelling.  CNS, she is alert and oriented  x2 with no focal neurological deficits.   ASSESSMENT:  Shirley Schneider is being admitted from the office with  suspected right middle lobe pneumonia, which is not responding to  outpatient therapy.   ADMISSION DIAGNOSES:  Bronchopneumonia, plan of care.  She will be  admitted to a medical bed.   Further laboratory data including CBC, CMET, chest x-ray, EKG,  urinalysis, and BNP were performed.  She will be started on IV Rocephin  1 g daily, IV half-normal saline at 35 mL an hour, and DVT prophylaxis  with subcu Lovenox 40 mg daily.  Home medication will be continued as  above.      Fleet Contras, M.D.  Electronically Signed     EA/MEDQ  D:  09/28/2008  T:  09/29/2008  Job:  [id number]

## 2011-03-21 NOTE — Op Note (Signed)
Shirley Schneider, Shirley Schneider              ACCOUNT NO.:  [id number]   MEDICAL RECORD NO.:  [id number]          PATIENT TYPE:  AMB   LOCATION:  CARD                         FACILITY:  [facility]   PHYSICIAN:  Kalman Shan, MD   DATE OF BIRTH:  1945/11/09   DATE OF PROCEDURE:  05/13/2008  DATE OF DISCHARGE:                               OPERATIVE REPORT   TYPE OF PROCEDURE:  Bronchoscopy with bronchoalveolar lavage,  transbronchial biopsies, subcarinal needle aspiration.   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   PREPROCEDURE ASSESSMENT:  The history and physical was less than 30 days  old.  Evaluation was done in the office on May 01, 2008.  ASA class was  class II.   On the day of procedure vital signs included height 65 inches, weight  180 pounds, blood pressure 145/86, pulse of 78, respiratory rate of 18.  ASA class II, airway assessment class I.   Exam was within normal limits except for old crackles in the chest.  Nil  by mouth was confirmed.  She also taken her antihypertensives on the  morning of procedure.   Laboratory values included hemoglobin of 10.8, platelet count 816,000,  INR of 1.1, PTT of 36 seconds, creatinine of 0.72, creatinine of 0.72 on  May 01, 2008.   The patient was acceptable for IV monitored sedation and could undergo  procedure.   PREPROCEDURE SEDATION:  Plan was monitored sedation with fentanyl and  Versed and topical lidocaine anesthesia.   PROCEDURE RECORD:  The large scope was introduced through the right  naris and 8:00 a.m.  A topical lidocaine was applied and vocal cords  looked normal.  The trachea was entered and looked normal.  The rings  looked sharp.  The carina looked sharp.  A detailed airway exam was done  on the right side.  This was normal.  Of note, there was no right upper  lobe apical segment.  There is only an anterior and posterior segments.  All the subsegments looked  normal.  There were no endobronchial lesions.  Attention was then turned to the left side.  The left main bronchus, the  left upper lobe takeoff, subsegments, lingula, left lower lobe  subsegments all looked normal.   Then a right middle lobe lavage was done using 20 mL of normal saline.  Returns from the first aliquot was discarded.  Subsequently three 20 mL  x normal saline lavages were done.  This was sent for analysis.  Following this, random biopsies were done and the transbronchial  biopsies were done in the right middle lobe, both medial and lateral  segments.  There was some oozing of heme (nonsignificant amounts) in the  right middle lobe lateral segment.  The right middle lobe was chosen for  biopsy after review of the radiology with Dr. Leanna Battles, thoracic  radiologist.   After confirmation of no abnormal bleeding, attention was turned to the  subcarinal region where there was a subcarinal node.  Dr. Fredirick Lathe had  felt that this node had enlarged compared to 2005.  Three subcarinal  needle aspirations were done with some minimal oozing of blood.  The  scope was then withdrawn at 8:22 a.m..   POSTPROCEDURE NOTES:  Fentanyl 100 mcg, Versed 8 mg and lidocaine 290 mL  was administered.  Fluoro time was 1 minute.  Estimated blood loss in  total was 2 to 3 mL.   COMPLICATIONS:  No complications.  The patient did complain of feeling  uneasy after bronch but vital signs were all normal.  There was no  wheezing on exam. Albuterol was administered post procedure as  precaution.  Chest x-ray post procedure showed no pneumothorax   POSTPROCEDURE PLAN:  1. Recovery room.  2. Await chest x-ray to rule out pneumothorax (later no pneumothorax)  3. She should call in 3 or 4 days for results.  4. Alveolar lavage being sent for CD-4, CV8, flow cytometry, cell      count, microbiology and cytology.  Transbronchial biopsies and      subcarinal needle being sent for histopathology and  cytology,      respectively.  5. Son, daughter updated post procedure      Kalman Shan, MD  Electronically Signed     MR/MEDQ  D:  05/13/2008  T:  05/13/2008  Job:  [id number]   cc:   Fleet Contras, M.D.  Fax: [phone]

## 2011-03-21 NOTE — H&P (Signed)
Shirley Schneider, Shirley Schneider              ACCOUNT NO.:  [id number]   MEDICAL RECORD NO.:  [id number]          PATIENT TYPE:  INP   LOCATION:  4702                         FACILITY:  [facility]   PHYSICIAN:  Fleet Contras, M.D.    DATE OF BIRTH:  Jul 05, 1946   DATE OF ADMISSION:  01/28/2008  DATE OF DISCHARGE:                              HISTORY & PHYSICAL   PRESENTING COMPLAINTS:  Cough and difficulty in breathing.   HISTORY OF PRESENT ILLNESS:  Shirley Schneider is a 65 year old African  American lady with past medical history significant for systemic  hypertension, bronchial asthma, chronic sinusitis, chronic bronchitis  and anxiety disorder.  She was admitted from home after she was seen in  the office a few days ago with 2 weeks history of cough, initially  nonproductive, associated with fevers, chills and sore throat.  She has  been feeling unwell and fatigued with weakness and lack of energy.  This  is associated with shortness of breath with mild exertion.  She denied  having any orthopnea, PND or palpitations, but does have some chest  discomfort when she coughs or takes a deep breath.  She had no abdominal  pain, vomiting or diarrhea.  She had no urinary symptoms.  Examination  in the office showed she was not in acute respiratory or painful  distress.  She was not dyspneic.  She had scattered rhonchi in her  lungs, and a chest x-ray did show infiltrate in her right lower zones,  and she was therefore started on oral antibiotic antibiotics with  Levaquin 100 mg daily.  She had a similar episode of pneumonia about  three months ago, and due to suspicion of possible mild lung disease, CT  scan of the chest was requested.  This was performed today at [facility].  It was reported as showing diffuse areas of consolidation and  infiltrates as well as mediastinal lymphadenopathy.  The patient was  called at home and advised to be admitted for further workup and  appropriate management.   PAST  MEDICAL HISTORY:  1. Systemic hypertension.  2. Gastroesophageal reflux disease.  3. Bronchial asthma.  4. Chronic bronchitis.  5. Chronic sinusitis.  6. Nasal polyps.  7. Arthritis of the lumbar spine.  8. Osteoporosis.  9. Anxiety disorder.  10.Dyslipidemia.   PAST SURGICAL HISTORY:  She has had sinus surgery twice.  She had an  abdominal hernia repair, tubal ligation.   MEDICATION HISTORY:  1. Zanaflex 4 mg q.h.s.  2. Albuterol HFA two puffs q.i.d. p.r.n.  3. Albuterol nebulized 2 puffs 5 mg q.6 h p.r.n.  4. Tranxene 7.5 mg b.i.d.  5. Soma 350 mg t.i.d.  6. Phenergan 25 mg t.i.d. p.r.n.  7. Citracal plus D one p.o. b.i.d.  8. Actonel 150 mg q. monthly.  9. Bystolic 10 mg daily.  10.Tussionex 5 ml b.i.d. p.r.n.  11.Symbicort 160/4.5 one puff q. 12.  12.Allegra 180 mg daily.  13.Prevacid 30 mg daily.  14.Bupap one p.o. b.i.d. p.r.n. for headaches.   ALLERGIES:  She is allergic to aspirin which causes nausea and vomiting.   FAMILY  HISTORY AND SOCIAL HISTORY:  Her father has history of diabetes,  heart disease, hypertension, stroke and thyroid disease.  Her mother is  deceased and had a history of hypertension and diabetes.  She has a  child with asthma.  She does not use any alcohol, tobacco or illicit  drugs.   REVIEW OF SYSTEMS:  Essentially as above.   PHYSICAL EXAMINATION:  GENERAL:  She is sitting on the chair, not in  acute respiratory or painful distress.  She is not pale.  She is not  icteric.  She is not cyanosed.  She is well-hydrated.  VITAL SIGNS:  Shows a blood pressure 140/90, heart rate of 86 regular,  respiratory rate of 16, temperature is 98.7 degrees Fahrenheit, O2 sats  on room air is 98%.  She weighs in at 199 pounds at a height of 5 feet 5  inches.  HEENT:  Oral mucosa shows white patches on her tongue and buccal mucosa  as well as her palate and pharynx.  NECK:  Supple with no elevated JVD.  No cervical lymphadenopathy and no  carotid bruits.   CHEST:  Shows good air entry bilaterally with scattered rhonchi and  expiratory wheezes.  HEART:  Sounds 1 and 2 are heard with no murmurs, no S3 gallops.  ABDOMEN:  Obese, soft, nontender, no masses.  Bowel sounds are present.  EXTREMITIES:  Shows no edema, no calf tenderness or swelling.  CNS:  She is alert and oriented x3 with no focal neurological deficits.   LABORATORY DATA:  Currently pending.   ASSESSMENT:  Shirley Schneider is a 65 year old African American lady admitted  directly from home with diagnosis of pneumonia which is partially  responding to outpatient therapy, but needs further evaluation in view  of recent findings on CT scan.   ADMISSION DIAGNOSIS:  1. Bilateral pneumonia.  2. Oral thrush.  3. Systemic hypertension.  4. Exacerbation of bronchial asthma.   PLAN OF CARE:  She will be admitted to a telemetry bed.  She will be on  2 grams sodium diet.  Strict I's and O's.  Activity as tolerated.  Vital  signs q.4 h.  Laboratory data would include CBC, C-met, urinalysis, EKG,  chest x-ray PA and lateral, ACE levels and check PPD.  She will be on IV  Zosyn 3.375 grams q.8 h, IV half normal saline at 75 mL an hour,  subacute Lovenox 40 mg daily.  Her home medication will be continued as  above.  She will likely  require abdominal CT scan to evaluate her progression and pulmonary  consult will be requested in the a.m.  She will be on oxygen via nasal  cannula to keep her O2 sats above 91%.  This plan of care has been  discussed with her and with her daughter and their questions answered.      Fleet Contras, M.D.  Electronically Signed     EA/MEDQ  D:  01/28/2008  T:  01/29/2008  Job:  [id number]

## 2011-03-24 NOTE — Op Note (Signed)
Shirley Schneider, Shirley Schneider              ACCOUNT NO.:  [id number]   MEDICAL RECORD NO.:  [id number]          PATIENT TYPE:  OIB   LOCATION:  5733                         FACILITY:  [facility]   PHYSICIAN:  Hermelinda Medicus, M.D.   DATE OF BIRTH:  01-30-1946   DATE OF PROCEDURE:  11/14/2006  DATE OF DISCHARGE:                               OPERATIVE REPORT   PREOPERATIVE DIAGNOSES:  Pansinusitis involving bilateral frontoethmoid,  right maxillary, left sphenoid, history of sinus surgery in 1970 and  2006, with history of chronic asthma and allergic bronchitis.   POSTOPERATIVE DIAGNOSES:  Pansinusitis involving bilateral  frontoethmoid, right maxillary, left sphenoid, history of sinus surgery  in 1970 and 2006, with history of chronic asthma and allergic  bronchitis.   OPERATION:  Functional endoscopic sinus surgery, bilateral  frontoethmoidectomy, right maxillary sinus ostial enlargement revision,  and a left sphenoidotomy, with removal of polyps.   OPERATOR:  Hermelinda Medicus, M.D.   ANESTHESIA:  General endotracheal anesthesia with Dr. Jacklynn Bue, with  local supplement, 1% Xylocaine with epinephrine.   The patient is aware of risks and gains, aware of the fact that her  orbital region is very close, the fact that the base of the skull is  very close to our procedure, that there are certain risks to this sinus  surgery.  And she is willing to accept these because she has had so many  sinus infections and so much pain.   PROCEDURE:  The patient was placed in supine position, and under general  endotracheal anesthesia, was anesthetized with 1% Xylocaine with  epinephrine.  Then, we outfractured the turbinates, and immediately we  found that the middle turbinates were pushed over laterally, attaching  themselves to the openings to the ethmoid sinuses, making the ethmoid  sinuses very difficult to drain.  We approached the left sphenoid sinus  first and found a narrower drainage site, but just  increased the size of  the sphenoid sinus opening.  On the left frontoethmoid, once we  medialized and removed a polyp from that left middle turbinate, we were  able to push the turbinate medially and gain some space, and then opened  the frontoethmoid by using 0- and 30-degree and 70-degree scopes, and  the upbiting Blakesley-Wilde forceps and straight Blakesley-Wilde.  Polyps and scar tissue were removed.  We could see up into the frontal  sinus now, suctioned the old mucopurulent debris and suctioned any  debris from this sinus.  Attention was then carried to the right  frontoethmoid region, where, again, we had to push the middle turbinate  medially to break the scar tissue.  We lysed those adhesions, entered  the frontoethmoid using the 0 and 70-degree scope, using the upbiting  and straight Blakesley-Wildes, and removed polyps and scar tissue from  this area and gained excellent access to the sinus.  The maxillary sinus  was again approached using the side-biting and backbiting forceps just  to increase the size of the natural ostium.  We could see up into the  frontal sinuses on both sides, up into the ethmoid on both sides, and  into  the maxillary.  We cleansed all the debris from this and we  suctioned mucus from this.  Gelfoam was then placed in the anterior  ethmoid.  Gelfilm was placed to keep the middle turbinates medial, and  then 10-cm Merocel packs were placed without difficulty.  The patient  tolerated the procedure well and  is doing well postop, will be kept overnight because of her asthma and  bronchitis history as a 23-hour observation.  Followup will be, then, on  Friday, and then in 5 days, and then 2 weeks, 4 weeks, 6 weeks, 3  months, 6 months, and a year.           ______________________________  Hermelinda Medicus, M.D.     JC/MEDQ  D:  11/14/2006  T:  11/14/2006  Job:  [id number]   cc:   Dr. Evelina Bucy

## 2011-03-24 NOTE — H&P (Signed)
Shirley Schneider, Shirley Schneider              ACCOUNT NO.:  [id number]   MEDICAL RECORD NO.:  [id number]          PATIENT TYPE:  AMB   LOCATION:  DSC                          FACILITY:  [facility]   PHYSICIAN:  Hermelinda Medicus, M.D.   DATE OF BIRTH:  1946-03-25   DATE OF ADMISSION:  11/21/2004  DATE OF DISCHARGE:                                HISTORY & PHYSICAL   HISTORY OF PRESENT ILLNESS:  This patient is a 65 year old female who had a  long history of sinusitis problems going back to the 1970s.  She had surgery  apparently at that time and has been having considerable sinus difficulties  recently and has been on multiple antibiotics with associated headaches. Her  last medication was Avalox 400 mg but she had previously been on Ketac and  before that had been lesser antibiotics.  She has also been on Diflucan,  Tussionex for cough.  She more recently after being treated with this Avalox  with very limited results, a CAT scan was obtained showing bilateral  ethmoid, left sphenoid, bilateral frontal and maxillary sinusitis.  She had  fluid levels and fullness in all these sinuses and she continues on her  decongestants and antibiotics, primarily the Ketac and the Avalox.  She has  been cleared now by Fleet Contras, M.D. for her surgery and enters for sinus  surgery under general endotracheal anesthesia.   PAST MEDICAL HISTORY:  Her past history is one of being on Tenormin 50 mg  daily, Albuterol 2 puffs four times daily as needed for, Albuterol 2.5 mg  nebulizer p.r.n., Tranzene 7.5 one tablet twice daily p.r.n., Soma 350 three  times daily p.r.n., Phenergan suppository p.r.n. 25 mg, Citrocal Plus D one  by mouth twice daily, Flonase one puff each nostril q.d., Pamelor SS one by  mouth twice daily p.r.n. and Zyrtec 10 mg one tablet daily and Prevacid  Solutabs 30 mg daily.   Her previous diagnoses are  1.  Arthritis.  2.  Asthma.  3.  Gastroesophageal reflux.  4.  Hypertension.  5.   Osteoporosis.  6.  Anxiety.  7.  Hyperlipidemia.  8.  Chronic sinusitis.   She does have some allergic symptoms.   PAST SURGICAL HISTORY:  Hernia repair.   PHYSICAL EXAMINATION:  VITAL SIGNS:  Blood pressure 160/96, heart rate 56,  oxygen saturations 99, weight 195.  HEENT:  Her ears are clear.  The tympanic membranes are clear.  The oral  cavity is clear of any ulceration of mass.  Nasopharynx is clear.  Her  sinuses still showing some drainage even in the face of Avalox, but the  nasal airway seems to be in quite good condition.  CHEST:  Clear, no rales, rhonchi or wheezes.  CARDIOVASCULAR:  No murmurs, rubs or gallops.  ABDOMEN:  Free of any organomegaly, tenderness or mass.  EXTREMITIES:  Unremarkable.   INITIAL DIAGNOSES:  1.  Pansinusitis.  2.  Arthritis.  3.  Asthma.  4.  Hypertension.  5.  Gastroesophageal reflux.  6.  Anxiety.  7.  Hyperlipidemia.   The patient has been cleared for surgery as  a mild to moderate risk for  perioperative complications.  We are keeping her overnight for close  observation and will expect her to do very well.       JC/MEDQ  D:  11/21/2004  T:  11/21/2004  Job:  [id number]   cc:   Fleet Contras, M.D.  [address]  [place]  [place] [address]  Fax: [phone]

## 2011-03-24 NOTE — Discharge Summary (Signed)
NAMENEDDA, GAINS              ACCOUNT NO.:  [id number]   MEDICAL RECORD NO.:  [id number]          PATIENT TYPE:  INP   LOCATION:  3028                         FACILITY:  [facility]   PHYSICIAN:  Fleet Contras, M.D.    DATE OF BIRTH:  1946/07/12   DATE OF ADMISSION:  01/28/2008  DATE OF DISCHARGE:  02/01/2008                               DISCHARGE SUMMARY   HISTORY OF PRESENT ILLNESS:  Ms. Siefken is a 65 year old African  American lady with past medical history significant for systemic  hypertension, bronchial asthma, chronic bronchitis, anxiety disorder,  chronic sinusitis who was admitted directly from home after she was seen  in the office with 2-week history of cough.  Initially nonproductive,  associated with fevers, chills, sore throat.  She was feeling unwell and  fatigued with weakness and lack of energy.She presented with shortness  of breath on mild exertion.  She had no orthopnea, PND, or palpitations  but did have some chest discomfort.  She coughed and took deep  breath.  She had scattered rhonchi in her lung.  Chest x-ray showed infiltrate in  the right lower lobe.  She was started on oral antibiotics with  Levaquin.  CT scan of the chest was performed due to persistent  symptoms, and this revealed diffuse areas of consolidation of  infiltrates as well as mediastinal lymphadenopathy.  She was therefore  returned to the hospital for further evaluation and appropriate  management.   HOSPITAL COURSE:  On admission, she was started on intravenous  antibiotics with Maalox.  A pulmonary consult was requested and the  patient was kindly seen by Dr. Marchelle Gearing who thought that the patient  had diffuse parenchymal infiltrates without resting hypoxemia.  Her  differential diagnoses were acute interstitial pneumonitis,  carcinomatosis, and viral pneumonia, alveolar hemorrhage, atypical  pneumonia, or sarcoidosis or CHF.  Skin test for TB was performed, and  this was negative.  She  had a hemoglobin of 10 g/dL.  An anemia panel  was performed.  This revealed an iron of 55, transferrin saturation of  21%,  ferritin of 52, TSH was 0.628, B12 325,  folate 3.6.  She was  therefore thought to have anemia of chronic disease.  2-D echocardiogram  was essentially normal with LV ejection fraction of 70%.  The patient  was therefore planned for bronchoscopy but on the day of this procedure,  her blood pressure was 170/115 despite taking her regular medications.  Procedure was therefore cancelled, postponed for outpatient scheduling  January 31, 2008.  She was feeling much better.  She had dry cough.  She  had no chest pain or shortness of breath at rest.   PHYSICAL EXAMINATION:  VITAL SIGNS: Blood pressure of 137/81, heart rate  of 81, O2 sats of 95% on room air, and temperature 97.4.  CHEST:  Clear to auscultation.   LABORATORY DATA:  White count of 9.7, hemoglobin 10, hematocrit 30.5,  and platelets 172.  Sodium was 137, potassium 3.6, chloride 109,  bicarbonate of 20, BUN 5, creatinine 0.07, and glucose of 87. Is  therefore considered stable for discharge  home.   DISCHARGE DIAGNOSES:  1. Diffuse lung disease, pneumonia.  2. Anemia of chronic disease.  3. Systemic hypertension.   CONDITION ON DISCHARGE:  Stable.   DISCHARGE MEDICATIONS:  1. Bystolic 10 grams daily.  2. Protonix 40 mg daily.  3. Allegra 180 mg daily.  4. Vicodin 2.5/500 one p.o. every 6 p.r.n.  5. Atenolol 50 mg daily.  6. Levaquin 750 mg daily for 5 days.   She is to have outpatient bronchoscopy performed by Dr. Marchelle Gearing.  She  is to follow up with me in the office in 2-4 weeks and with Dr.  Marchelle Gearing in 1 week.      Fleet Contras, M.D.  Electronically Signed     EA/MEDQ  D:  03/18/2008  T:  03/19/2008  Job:  [id number]

## 2011-03-24 NOTE — H&P (Signed)
NAMECHASTA, DESHPANDE              ACCOUNT NO.:  [id number]   MEDICAL RECORD NO.:  [id number]          PATIENT TYPE:  AMB   LOCATION:  DSC                          FACILITY:  [facility]   PHYSICIAN:  Hermelinda Medicus, M.D.   DATE OF BIRTH:  December 13, 1945   DATE OF ADMISSION:  11/21/2004  DATE OF DISCHARGE:                                HISTORY & PHYSICAL   HISTORY OF PRESENT ILLNESS:  The patient is a 65 year old female who has had  persistent sinusitis.  She had sinus surgery back in 1970.  She has been  seen under my care on several occasions and has been on multiple rounds of  antibiotics.  A CAT scan was obtained showing pansinusitis disease with  sinus opacity most pronounced on the left front ethmoid and sphenoid region  with thickening of the maxillary sinuses on both sides with mucous membrane  thickening and with fluid in the right ethmoid.  She now enters for a  bilateral ethmoidectomy and frontalotomies, bilateral maxillary sinus ostial  enlargement with a left sphenoidotomy.  DICTATION ENDED HERE.       JC/MEDQ  D:  11/21/2004  T:  11/21/2004  Job:  [id number]   cc:   Fleet Contras, M.D.  [address]  [place]  [place] [address]  Fax: [phone]

## 2011-03-24 NOTE — H&P (Signed)
Shirley Schneider, Shirley Schneider              ACCOUNT NO.:  [id number]   MEDICAL RECORD NO.:  [id number]          PATIENT TYPE:  AMB   LOCATION:  SDS                          FACILITY:  [facility]   PHYSICIAN:  Hermelinda Medicus, M.D.   DATE OF BIRTH:  [birth year]   DATE OF ADMISSION:  11/14/2006  DATE OF DISCHARGE:                              HISTORY & PHYSICAL   HISTORY OF PRESENT ILLNESS:  This patient is a 65 year old female who  has had sinus surgery back in 1970 and then has had sinus surgery  approximately a year ago, has considerable difficulty with her left  sphenoid, bilateral frontal ethmoid, and right maxillary sinuses.  She  has had persistent infections.  She has been Levaquin on several  occasions and is on Levaquin at this time and now returns after having a  CT scan showing opacification of the left sphenoid and bilateral frontal  and right maxillary sinus.  We feel she has scar tissue that has formed  in that area and also polypoid changes that have given her further  obstruction and, therefore, further sinus infections and pain.  She now  enters for a revision of functional endoscopic sinus surgery for a  bilateral frontal ethmoidectomy, right maxillary sinus ostial  enlargement revision, and a left sphenoidotomy.   PAST MEDICAL HISTORY:  1. Arthritis.  2. Asthma.  3. Gastroesophageal reflux disease.  4. Hypertension.  5. Osteoporosis.  6. Anxiety.  7. Sinusitis.  8. Nasal polyposis.  9. Hyperlipidemia.  10.Bronchitis.   PAST SURGICAL HISTORY:  1. Sinus surgery in the 70s.  2. Sinus surgery in 2006.  3. Previous stress test and echo, which is found to be in good status.  4. She has had bronchitis and asthma and is on medications for this.  5. She has had osteoporosis.   MEDICATIONS:  Her medications listed are:  1. Tenormin 50 daily.  2. Albuterol inhalant 2 puffs in the a.m.  3. Flovent 1 spray b.i.d.  4. Allegra D 12 one-half  5. Guaifenex one-half b.i.d.  6.  Lidocaine patch q.12 hours.  7. Relafen two 750 daily.  This has been held up just recently.  8. Prevacid 30 daily.  9. Albuterol nebulizer.  10.Levaquin 500 and now is 750 daily.  11.Tussex 1 tsp q.12 hours.  12.Soma 350 three times a day.  13.Bupap b.i.d. p.r.n.  14.Hydrocodone 7.5/750 q.6-8 hours p.r.n.  15.Tranxene 7.5 mg b.i.d. p.r.n.  16.Zanaflex 4 mg h.s. p.r.n.   SOCIAL HISTORY:  She does not smoke or drink.   FAMILY HISTORY:  Essentially unremarkable.   ALLERGIES:  ASPIRIN CAUSES A RASH.   LABORATORY TESTS:  Her chest x-ray showed some mild atelectasis.  Her  EKG was within normal limits.   REVIEW OF SYSTEMS:  She does have a history of migraine.  She has  gastroesophageal reflux disease, and pansinusitis.   PHYSICAL EXAMINATION:  VITAL SIGNS:  Blood pressure 136/88, pulse 80,  temperature 98.3, weighs 102 kg, she is 5'4.  HEENT:  Her ears are clear, the tympanic membranes are clear.  Her nose  is quite clear except she  does show left polypoid changes and especially  in the middle turbinate and she shows scar tissue pushing turbinates  lateral blocking off the ethmoid sinuses on both sides.  The nasopharynx  appears to be clear of any ulceration or mass.  Her larynx is clear to  __________  base of tongue are clear of ulceration or mass.  Her lips,  teeth, and gums are clear of any ulceration or mass.  NECK:  Free of any thyromegaly, cervical adenopathy, or mass.  NEURO:  Two cord mobility, gag reflex, __________  , facial nerve are  all symmetrical as is shoulder strength.  CHEST:  Clear, no rales, rhonchi, or wheezes.  CARDIOVASCULAR:  No murmurs, rubs, or gallops.  ABDOMEN:  Mildly obese.  No hepatosplenomegaly.  EXTREMITIES:  Essentially unremarkable.   INITIAL DIAGNOSES:  1. Pansinusitis with history of sinus surgery going back to 1970s.  2. History of mild atelectasis and asthma and bronchitis.  3. History of gastroesophageal reflux disease.  4. History of  osteoporosis.  5. Hypertension.  6. Arthritis.  7. Hyperlipidemia.  8. Bronchitis chronic.           ______________________________  Hermelinda Medicus, M.D.    JC/MEDQ  D:  11/14/2006  T:  11/14/2006  Job:  [id number]   cc:   Fleet Contras, M.D.

## 2011-03-24 NOTE — Op Note (Signed)
[name], Shirley Schneider              ACCOUNT NO.:  [id number]   MEDICAL RECORD NO.:  [id number]          PATIENT TYPE:  AMB   LOCATION:  DSC                          FACILITY:  [facility]   PHYSICIAN:  Hermelinda Medicus, M.D.   DATE OF BIRTH:  September 12, 1946   DATE OF PROCEDURE:  11/21/2004  DATE OF DISCHARGE:                                 OPERATIVE REPORT   PREOPERATIVE DIAGNOSES:  Bilateral ethmoid, frontal and maxillary sinusitis  with left sphenoid sinusitis.  History of sinus surgery in 1970's.   POSTOPERATIVE DIAGNOSES:  Bilateral ethmoid, frontal and maxillary sinusitis  with left sphenoid sinusitis.  History of sinus surgery in 1970's.   SURGEON:  Hermelinda Medicus, M.D.   ANESTHESIA:  General endotracheal with Judie Petit, M.D.   DESCRIPTION OF PROCEDURE:  The patient was placed in supine position and  under general endotracheal anesthesia was anesthetized further with topical  cocaine 200 mg and 1% Xylocaine with epinephrine 3 mL.  The inferior  turbinates were out fractured, the middle turbinate on the left was pushed  lateral, the sphenoid sinus natural ostium was found and this was opened and  drained as there was fluid in that left sphenoid.  Once this was drained, we  did not take a pathologic specimen, we just opened this and made sure it was  widely dilated so it would drain better in the future. We then pushed the  metal turbinate medial and approached the ethmoid sinuses which were filled  with polyps on the left again using the zero degree and the 70 degree scope.  We removed the debris from the ethmoid sinus. We looked at the frontal sinus  of the 70 degree scope and using the upbiting forceps we removed tissue from  that area so we could see the nasal frontal duct region.  When that was  completely clear, we then worked toward the maxillary sinus where we found  the natural ostium and made it five times its normal size. We also did an  antrostomy and we cleared any  scar tissue and adhesions from around this  natural ostium.  On the right side, we then approached the ethmoid sinus  using again the zero and 70 degree scopes and again we removed polyps from  the side and again we saw the nasal frontal region and again we had the  sinus completely open and free from polyps.  We then approached the  maxillary sinus on the right side and opened this natural ostium  approximately five times its normal size and did an antrostomy also and  removed the scar tissue from around this area. At this point, we used the  upbiting Blakesley-Wilde forceps and the side biting forceps. We then placed  Gelfoam within the ethmoid sinus __________ to hold the middle turbinates  medial and telfa for intranasal dressing. The patient tolerated the  procedure well and is doing well postop.  In followup, we will be watching  her overnight because of her cardiac history and then we will followup with  her in one week, three weeks, six weeks, three months, six months  and a  year.       JC/MEDQ  D:  11/21/2004  T:  11/21/2004  Job:  [id number]   cc:   Fleet Contras, M.D.  [address]  [place]  [place] [address]  Fax: [phone]

## 2011-07-31 LAB — COMPREHENSIVE METABOLIC PANEL
AST: 15
Albumin: 3.2 — ABNORMAL LOW
Calcium: 9.1
Creatinine, Ser: 0.64
GFR calc Af Amer: >60
GFR calc non Af Amer: >60
Sodium: 138
Total Protein: 8.6 — ABNORMAL HIGH

## 2011-07-31 LAB — HEPATIC FUNCTION PANEL
Albumin: 3 — ABNORMAL LOW
Bilirubin, Direct: 0.1
Indirect Bilirubin: 0.4
Total Bilirubin: 0.5

## 2011-07-31 LAB — URINALYSIS, MICROSCOPIC ONLY
Bilirubin Urine: NEGATIVE
Glucose, UA: NEGATIVE
Nitrite: NEGATIVE
Specific Gravity, Urine: 1.015
pH: 6.5

## 2011-07-31 LAB — BLOOD GAS, ARTERIAL
Bicarbonate: 19.2 — ABNORMAL LOW
FIO2: 0.21
O2 Saturation: 95.6
Patient temperature: 98.6
TCO2: 20.2

## 2011-07-31 LAB — CBC
Hemoglobin: 10 — ABNORMAL LOW
MCHC: 32.7
MCHC: 32.9
MCV: 88.6
Platelets: 856 — ABNORMAL HIGH
RDW: 19.1 — ABNORMAL HIGH
RDW: 19.1 — ABNORMAL HIGH

## 2011-07-31 LAB — BASIC METABOLIC PANEL
BUN: 6
CO2: 19
Calcium: 8.9
Chloride: 108
Creatinine, Ser: 0.68
GFR calc Af Amer: >60
GFR calc non Af Amer: >60
Glucose, Bld: 103 — ABNORMAL HIGH
Glucose, Bld: 87
Sodium: 139

## 2011-07-31 LAB — LEGIONELLA ANTIGEN, URINE

## 2011-07-31 LAB — HIV 1/2 CONFIRMATION
HIV-1 antibody: NEGATIVE
HIV-2 Ab: NEGATIVE

## 2011-07-31 LAB — FERRITIN: Ferritin: 52 (ref 10–291)

## 2011-07-31 LAB — IRON AND TIBC
Saturation Ratios: 21
TIBC: 261

## 2011-07-31 LAB — B-NATRIURETIC PEPTIDE (CONVERTED LAB): Pro B Natriuretic peptide (BNP): 174 — ABNORMAL HIGH

## 2011-07-31 LAB — CARDIAC PANEL(CRET KIN+CKTOT+MB+TROPI): CK, MB: 1.2

## 2011-07-31 LAB — TSH: TSH: 0.628

## 2011-07-31 LAB — PHOSPHORUS: Phosphorus: 3.4

## 2011-07-31 LAB — MAGNESIUM: Magnesium: 2

## 2011-07-31 LAB — APTT: aPTT: 33

## 2011-07-31 LAB — RSV SCREEN (NASOPHARYNGEAL) NOT AT ARMC: RSV Ag, EIA: NEGATIVE

## 2011-07-31 LAB — INFLUENZA A+B VIRUS AG-DIRECT(RAPID): Influenza B Ag: NEGATIVE

## 2011-08-03 LAB — LEGIONELLA PROFILE(CULTURE+DFA/SMEAR): Legionella Antigen (DFA): NEGATIVE

## 2011-08-03 LAB — FUNGUS CULTURE W SMEAR

## 2011-08-03 LAB — BODY FLUID CELL COUNT WITH DIFFERENTIAL
Lymphs, Fluid: 13
Neutrophil Count, Fluid: 21

## 2011-08-08 LAB — BASIC METABOLIC PANEL
BUN: 11
Calcium: 7.7 — ABNORMAL LOW
Creatinine, Ser: 0.67
GFR calc non Af Amer: >60
Glucose, Bld: 82

## 2011-08-08 LAB — CBC
Platelets: 422 — ABNORMAL HIGH
RDW: 16.7 — ABNORMAL HIGH

## 2011-08-17 LAB — DIFFERENTIAL
Basophils Absolute: 0
Lymphocytes Relative: 28
Monocytes Absolute: 0.6
Neutro Abs: 4.1
Neutrophils Relative %: 61

## 2011-08-17 LAB — CBC
Hemoglobin: 11.1 — ABNORMAL LOW
RBC: 3.69 — ABNORMAL LOW
RDW: 16.7 — ABNORMAL HIGH

## 2011-08-17 LAB — BASIC METABOLIC PANEL
Calcium: 9.1
Creatinine, Ser: 0.76
GFR calc Af Amer: >60
GFR calc non Af Amer: >60
Glucose, Bld: 93
Sodium: 139

## 2012-10-21 ENCOUNTER — Other Ambulatory Visit: Payer: Self-pay | Admitting: Internal Medicine

## 2012-10-21 DIAGNOSIS — Z1231 Encounter for screening mammogram for malignant neoplasm of breast: Secondary | ICD-10-CM

## 2012-11-26 ENCOUNTER — Ambulatory Visit: Payer: Medicare Other

## 2012-12-19 ENCOUNTER — Ambulatory Visit: Payer: Medicare Other

## 2013-07-24 ENCOUNTER — Encounter (HOSPITAL_COMMUNITY): Payer: Self-pay

## 2013-07-24 ENCOUNTER — Emergency Department (HOSPITAL_COMMUNITY)
Admission: EM | Admit: 2013-07-24 | Discharge: 2013-07-24 | Disposition: A | Discharge status: Home / Self Care | Payer: Medicare Other | Attending: Emergency Medicine | Admitting: Emergency Medicine

## 2013-07-24 DIAGNOSIS — I10 Essential (primary) hypertension: Secondary | ICD-10-CM | POA: Insufficient documentation

## 2013-07-24 DIAGNOSIS — J45909 Unspecified asthma, uncomplicated: Secondary | ICD-10-CM | POA: Insufficient documentation

## 2013-07-24 DIAGNOSIS — L89309 Pressure ulcer of unspecified buttock, unspecified stage: Secondary | ICD-10-CM | POA: Insufficient documentation

## 2013-07-24 DIAGNOSIS — Z8739 Personal history of other diseases of the musculoskeletal system and connective tissue: Secondary | ICD-10-CM | POA: Insufficient documentation

## 2013-07-24 DIAGNOSIS — L8990 Pressure ulcer of unspecified site, unspecified stage: Secondary | ICD-10-CM

## 2013-07-24 DIAGNOSIS — Z79899 Other long term (current) drug therapy: Secondary | ICD-10-CM | POA: Insufficient documentation

## 2013-07-24 HISTORY — DX: Unspecified asthma, uncomplicated: J45.909

## 2013-07-24 HISTORY — DX: Fibromyalgia: M79.7

## 2013-07-24 HISTORY — DX: Essential (primary) hypertension: I10

## 2013-07-24 MED ORDER — HYDROCODONE-ACETAMINOPHEN 5-325 MG PO TABS: 1.0000 | ORAL_TABLET | ORAL | Status: DC | PRN

## 2013-07-24 MED ORDER — CEPHALEXIN 500 MG PO CAPS: 500.0000 mg | ORAL_CAPSULE | Freq: Three times a day (TID) | ORAL | Status: DC

## 2013-07-24 MED ORDER — BACITRACIN-NEOMYCIN-POLYMYXIN OINTMENT TUBE
TOPICAL_OINTMENT | Freq: Every day | CUTANEOUS | Status: DC
  Administered 2013-07-24: 12:00:00 via TOPICAL
  Filled 2013-07-24: qty 15

## 2013-07-24 NOTE — ED Notes (Signed)
Bed: WA09 Expected date:  Expected time:  Means of arrival:  Comments: ems 

## 2013-07-24 NOTE — ED Notes (Signed)
Reviewed Discharge instructions with patient and daughter. Patient waiting on [facility] for transportation home

## 2013-07-24 NOTE — Progress Notes (Signed)
Pt confirms pcp is edwin avbuere [facility] updated  

## 2013-07-24 NOTE — ED Provider Notes (Signed)
CSN: [id number]     Arrival date & time 07/24/13  1015 History   First MD Initiated Contact with Patient 07/24/13 1032     Chief Complaint  Patient presents with  . Sore   (Consider location/radiation/quality/duration/timing/severity/associated sxs/prior Treatment) HPI Comments: 67 yo female with htn, non smoker presents with skin wounds on buttocks.  Pt has an old hospital bed and the springs have been placing pressure on her buttocks for a few weeks.  She has developed multiple burn like wounds on her buttocks.  No hx of similar.  No fevers.  Pt has pcp for fup.  She has family support.  Pain with palpation. No new medicines.   The history is provided by the patient.    Past Medical History  Diagnosis Date  . Fibromyalgia   . Asthma   . Hypertension    Past Surgical History  Procedure Laterality Date  . Hernia repair  1970   Family History  Problem Relation Age of Onset  . Family history unknown: Yes   History  Substance Use Topics  . Smoking status: Never Smoker   . Smokeless tobacco: Not on file  . Alcohol Use: No   OB History   Grav Para Term Preterm Abortions TAB SAB Ect Mult Living                 Review of Systems  Constitutional: Positive for appetite change. Negative for fever and chills.  HENT: Negative for neck pain and neck stiffness.   Eyes: Negative for visual disturbance.  Respiratory: Negative for shortness of breath.   Cardiovascular: Negative for chest pain.  Gastrointestinal: Negative for vomiting and abdominal pain.  Genitourinary: Negative for dysuria and flank pain.  Musculoskeletal: Negative for back pain.  Skin: Positive for wound. Negative for rash.  Neurological: Negative for light-headedness and headaches.    Allergies  Aspirin  Home Medications   Current Outpatient Rx  Name  Route  Sig  Dispense  Refill  . albuterol (PROVENTIL HFA;VENTOLIN HFA) 108 (90 BASE) MCG/ACT inhaler   Inhalation   Inhale 2 puffs into the lungs every 6  (six) hours as needed for wheezing.         Marland Kitchen atenolol (TENORMIN) 50 MG tablet   Oral   Take 50 mg by mouth daily.         Marland Kitchen bismuth subsalicylate (KAOPECTATE) 262 MG/15ML suspension   Oral   Take 15 mLs by mouth every 6 (six) hours as needed for indigestion.         . budesonide-formoterol (SYMBICORT) 160-4.5 MCG/ACT inhaler   Inhalation   Inhale 2 puffs into the lungs 2 (two) times daily.         . carisoprodol (SOMA) 350 MG tablet   Oral   Take 175-350 mg by mouth 2 (two) times daily.         . hydrochlorothiazide (HYDRODIURIL) 25 MG tablet   Oral   Take 25 mg by mouth daily.         Marland Kitchen HYDROcodone-acetaminophen (NORCO) 7.5-325 MG per tablet   Oral   Take 1 tablet by mouth every 6 (six) hours as needed for pain.         Marland Kitchen lidocaine (LIDODERM) 5 %   Transdermal   Place 3 patches onto the skin daily as needed (for pain). Remove & Discard patch within 12 hours or as directed by , apply to joints         . tiZANidine (ZANAFLEX) 4  MG tablet   Oral   Take 4 mg by mouth at bedtime.          BP 118/62  Pulse 62  Temp(Src) 97.6 F (36.4 C) (Oral)  Resp 18  SpO2 100% Physical Exam  Nursing note and vitals reviewed. Constitutional: She is oriented to person, place, and time. She appears well-developed and well-nourished.  HENT:  Head: Normocephalic and atraumatic.  Eyes: Conjunctivae are normal. Right eye exhibits no discharge. Left eye exhibits no discharge.  Neck: Normal range of motion. Neck supple. No tracheal deviation present.  Cardiovascular: Normal rate and regular rhythm.   Pulmonary/Chest: Effort normal and breath sounds normal.  Abdominal: Soft. She exhibits no distension. There is no tenderness. There is no guarding.  Obese morbid  Musculoskeletal: She exhibits tenderness. She exhibits no edema.  Neurological: She is alert and oriented to person, place, and time.  Skin: Skin is warm. No rash noted.  Multiple superficial ulcerations/ first  degree appearing burns, circular on buttocks/ dependent areas bilateral.  No bone visualized.  No drainage.  Pink tissue.    Psychiatric: She has a normal mood and affect.    ED Course  Procedures (including critical care time) Labs Review Labs Reviewed - No data to display Imaging Review No results found.  MDM  No diagnosis found. Multiple pressure sores. Family with patient.  They do feel comfortable caring for mother at home and fup closely with pcp. I discussed that she need multiple rotations and standing/ leaning to avoid increased pressure. If wounds worsen or do not heal she will likely need assisted living/ temp NH for which they said they will discuss with pcp.  No concern for maltreatment at this time.  They will need to replace mattress.  PO keflex at this time . DC   Enid Skeens, MD 07/24/13 1126

## 2013-07-24 NOTE — ED Notes (Signed)
Per [facility] pt c/o pain and skin breakdown on buttocks, states laid in bed x2wks d/t having the flu; pt also c/o spring came out through mattress and scratched her on her buttocks

## 2014-04-04 ENCOUNTER — Inpatient Hospital Stay (HOSPITAL_COMMUNITY): Payer: Medicare Other

## 2014-04-04 ENCOUNTER — Encounter (HOSPITAL_COMMUNITY): Payer: Self-pay | Admitting: Emergency Medicine

## 2014-04-04 ENCOUNTER — Emergency Department (HOSPITAL_COMMUNITY): Payer: Medicare Other

## 2014-04-04 ENCOUNTER — Inpatient Hospital Stay (HOSPITAL_COMMUNITY)
Admission: EM | Admit: 2014-04-04 | Discharge: 2014-04-14 | DRG: 870 | Disposition: A | Discharge status: Home Health | Payer: Medicare Other | Attending: Internal Medicine | Admitting: Internal Medicine

## 2014-04-04 DIAGNOSIS — E872 Acidosis, unspecified: Secondary | ICD-10-CM | POA: Diagnosis present

## 2014-04-04 DIAGNOSIS — R579 Shock, unspecified: Secondary | ICD-10-CM

## 2014-04-04 DIAGNOSIS — J45909 Unspecified asthma, uncomplicated: Secondary | ICD-10-CM | POA: Diagnosis present

## 2014-04-04 DIAGNOSIS — I214 Non-ST elevation (NSTEMI) myocardial infarction: Secondary | ICD-10-CM | POA: Diagnosis present

## 2014-04-04 DIAGNOSIS — E876 Hypokalemia: Secondary | ICD-10-CM | POA: Diagnosis not present

## 2014-04-04 DIAGNOSIS — A419 Sepsis, unspecified organism: Principal | ICD-10-CM | POA: Diagnosis present

## 2014-04-04 DIAGNOSIS — R5381 Other malaise: Secondary | ICD-10-CM | POA: Diagnosis not present

## 2014-04-04 DIAGNOSIS — E875 Hyperkalemia: Secondary | ICD-10-CM | POA: Diagnosis present

## 2014-04-04 DIAGNOSIS — J9601 Acute respiratory failure with hypoxia: Secondary | ICD-10-CM

## 2014-04-04 DIAGNOSIS — E873 Alkalosis: Secondary | ICD-10-CM | POA: Diagnosis not present

## 2014-04-04 DIAGNOSIS — J9600 Acute respiratory failure, unspecified whether with hypoxia or hypercapnia: Secondary | ICD-10-CM

## 2014-04-04 DIAGNOSIS — R6520 Severe sepsis without septic shock: Secondary | ICD-10-CM

## 2014-04-04 DIAGNOSIS — IMO0001 Reserved for inherently not codable concepts without codable children: Secondary | ICD-10-CM | POA: Diagnosis present

## 2014-04-04 DIAGNOSIS — N19 Unspecified kidney failure: Secondary | ICD-10-CM

## 2014-04-04 DIAGNOSIS — J8409 Other alveolar and parieto-alveolar conditions: Secondary | ICD-10-CM

## 2014-04-04 DIAGNOSIS — I509 Heart failure, unspecified: Secondary | ICD-10-CM | POA: Diagnosis present

## 2014-04-04 DIAGNOSIS — N179 Acute kidney failure, unspecified: Secondary | ICD-10-CM | POA: Diagnosis present

## 2014-04-04 DIAGNOSIS — I10 Essential (primary) hypertension: Secondary | ICD-10-CM

## 2014-04-04 DIAGNOSIS — R0902 Hypoxemia: Secondary | ICD-10-CM

## 2014-04-04 DIAGNOSIS — J189 Pneumonia, unspecified organism: Secondary | ICD-10-CM

## 2014-04-04 DIAGNOSIS — R6521 Severe sepsis with septic shock: Secondary | ICD-10-CM

## 2014-04-04 DIAGNOSIS — D649 Anemia, unspecified: Secondary | ICD-10-CM | POA: Diagnosis not present

## 2014-04-04 DIAGNOSIS — Z79899 Other long term (current) drug therapy: Secondary | ICD-10-CM

## 2014-04-04 LAB — BASIC METABOLIC PANEL
BUN: 38 mg/dL — ABNORMAL HIGH (ref 6–23)
CO2: 17 mEq/L — ABNORMAL LOW (ref 19–32)
Calcium: 8.6 mg/dL (ref 8.4–10.5)
Chloride: 102 mEq/L (ref 96–112)
Creatinine, Ser: 2.17 mg/dL — ABNORMAL HIGH (ref 0.50–1.10)
GFR calc non Af Amer: 22 mL/min — ABNORMAL LOW (ref >90)
GFR, EST AFRICAN AMERICAN: 26 mL/min — AB (ref >90)
Glucose, Bld: 85 mg/dL (ref 70–99)
POTASSIUM: 5 meq/L (ref 3.7–5.3)
SODIUM: 137 meq/L (ref 137–147)

## 2014-04-04 LAB — COMPREHENSIVE METABOLIC PANEL
ALBUMIN: 3.7 g/dL (ref 3.5–5.2)
ALT: 5 U/L (ref 0–35)
AST: 16 U/L (ref 0–37)
Alkaline Phosphatase: 104 U/L (ref 39–117)
BUN: 39 mg/dL — ABNORMAL HIGH (ref 6–23)
CALCIUM: 9.2 mg/dL (ref 8.4–10.5)
CO2: 21 mEq/L (ref 19–32)
Chloride: 100 mEq/L (ref 96–112)
Creatinine, Ser: 2.28 mg/dL — ABNORMAL HIGH (ref 0.50–1.10)
GFR calc Af Amer: 24 mL/min — ABNORMAL LOW (ref >90)
GFR calc non Af Amer: 21 mL/min — ABNORMAL LOW (ref >90)
Glucose, Bld: 91 mg/dL (ref 70–99)
Potassium: 5.4 mEq/L — ABNORMAL HIGH (ref 3.7–5.3)
SODIUM: 133 meq/L — AB (ref 137–147)
TOTAL PROTEIN: 7.6 g/dL (ref 6.0–8.3)
Total Bilirubin: 0.3 mg/dL (ref 0.3–1.2)

## 2014-04-04 LAB — BLOOD GAS, ARTERIAL
ACID-BASE DEFICIT: 8.1 mmol/L — AB (ref 0.0–2.0)
BICARBONATE: 18.4 meq/L — AB (ref 20.0–24.0)
Delivery systems: POSITIVE
Drawn by: 312761
Expiratory PAP: 5
FIO2: 0.4 %
INSPIRATORY PAP: 10
O2 Saturation: 98.4 %
PO2 ART: 93.4 mmHg (ref 80.0–100.0)
Patient temperature: 98.6
TCO2: 19.9 mmol/L (ref 0–100)
pCO2 arterial: 48.4 mmHg — ABNORMAL HIGH (ref 35.0–45.0)
pH, Arterial: 7.205 — ABNORMAL LOW (ref 7.350–7.450)

## 2014-04-04 LAB — I-STAT ARTERIAL BLOOD GAS, ED
ACID-BASE DEFICIT: 7 mmol/L — AB (ref 0.0–2.0)
Bicarbonate: 20.2 mEq/L (ref 20.0–24.0)
O2 SAT: 85 %
Patient temperature: 98.6
TCO2: 22 mmol/L (ref 0–100)
pCO2 arterial: 46.5 mmHg — ABNORMAL HIGH (ref 35.0–45.0)
pH, Arterial: 7.247 — ABNORMAL LOW (ref 7.350–7.450)
pO2, Arterial: 59 mmHg — ABNORMAL LOW (ref 80.0–100.0)

## 2014-04-04 LAB — CBC
HCT: 40.5 % (ref 36.0–46.0)
HEMOGLOBIN: 12.3 g/dL (ref 12.0–15.0)
MCH: 28.9 pg (ref 26.0–34.0)
MCHC: 30.4 g/dL (ref 30.0–36.0)
MCV: 95.3 fL (ref 78.0–100.0)
Platelets: 319 10*3/uL (ref 150–400)
RBC: 4.25 MIL/uL (ref 3.87–5.11)
RDW: 15.3 % (ref 11.5–15.5)
WBC: 9.5 10*3/uL (ref 4.0–10.5)

## 2014-04-04 LAB — CBC WITH DIFFERENTIAL/PLATELET
BASOS ABS: 0 10*3/uL (ref 0.0–0.1)
Basophils Relative: 0 % (ref 0–1)
EOS ABS: 0 10*3/uL (ref 0.0–0.7)
EOS PCT: 0 % (ref 0–5)
HCT: 41.9 % (ref 36.0–46.0)
Hemoglobin: 12.4 g/dL (ref 12.0–15.0)
Lymphocytes Relative: 12 % (ref 12–46)
Lymphs Abs: 0.8 10*3/uL (ref 0.7–4.0)
MCH: 28.4 pg (ref 26.0–34.0)
MCHC: 29.6 g/dL — ABNORMAL LOW (ref 30.0–36.0)
MCV: 95.9 fL (ref 78.0–100.0)
Monocytes Absolute: 0.7 10*3/uL (ref 0.1–1.0)
Monocytes Relative: 10 % (ref 3–12)
Neutro Abs: 5.4 10*3/uL (ref 1.7–7.7)
Neutrophils Relative %: 78 % — ABNORMAL HIGH (ref 43–77)
PLATELETS: 298 10*3/uL (ref 150–400)
RBC: 4.37 MIL/uL (ref 3.87–5.11)
RDW: 15 % (ref 11.5–15.5)
WBC: 7 10*3/uL (ref 4.0–10.5)

## 2014-04-04 LAB — CARBOXYHEMOGLOBIN
Carboxyhemoglobin: 1.1 % (ref 0.5–1.5)
METHEMOGLOBIN: 1.1 % (ref 0.0–1.5)
O2 SAT: 67.3 %
Total hemoglobin: 11.8 g/dL — ABNORMAL LOW (ref 12.0–16.0)

## 2014-04-04 LAB — LACTIC ACID, PLASMA
Lactic Acid, Venous: 2.6 mmol/L — ABNORMAL HIGH (ref 0.5–2.2)
Lactic Acid, Venous: 3 mmol/L — ABNORMAL HIGH (ref 0.5–2.2)

## 2014-04-04 LAB — PHOSPHORUS: PHOSPHORUS: 4.2 mg/dL (ref 2.3–4.6)

## 2014-04-04 LAB — TROPONIN I: Troponin I: 0.45 ng/mL (ref <0.30)

## 2014-04-04 LAB — I-STAT CG4 LACTIC ACID, ED: Lactic Acid, Venous: 2.36 mmol/L — ABNORMAL HIGH (ref 0.5–2.2)

## 2014-04-04 LAB — TYPE AND SCREEN
ABO/RH(D): A POS
Antibody Screen: NEGATIVE

## 2014-04-04 LAB — MAGNESIUM: Magnesium: 1.8 mg/dL (ref 1.5–2.5)

## 2014-04-04 LAB — PRO B NATRIURETIC PEPTIDE: PRO B NATRI PEPTIDE: 6040 pg/mL — AB (ref 0–125)

## 2014-04-04 MED ORDER — BIOTENE DRY MOUTH MT LIQD: 15.0000 mL | Freq: Two times a day (BID) | OROMUCOSAL | Status: DC

## 2014-04-04 MED ORDER — ALBUTEROL SULFATE (2.5 MG/3ML) 0.083% IN NEBU: 2.5000 mg | INHALATION_SOLUTION | RESPIRATORY_TRACT | Status: DC | PRN

## 2014-04-04 MED ORDER — SODIUM CHLORIDE 0.9 % IV SOLN
250.0000 mL | INTRAVENOUS | Status: DC | PRN
  Administered 2014-04-07 – 2014-04-08 (×2): via INTRAVENOUS

## 2014-04-04 MED ORDER — DEXTROSE 5 % IV SOLN: 500.0000 mg | INTRAVENOUS | Status: DC

## 2014-04-04 MED ORDER — NOREPINEPHRINE BITARTRATE 1 MG/ML IV SOLN
2.0000 ug/min | INTRAVENOUS | Status: DC
  Administered 2014-04-04: 10 ug/min via INTRAVENOUS
  Filled 2014-04-04: qty 4

## 2014-04-04 MED ORDER — DEXTROSE 5 % IV SOLN
500.0000 mg | Freq: Once | INTRAVENOUS | Status: AC
  Administered 2014-04-04: 500 mg via INTRAVENOUS

## 2014-04-04 MED ORDER — HEPARIN SODIUM (PORCINE) 5000 UNIT/ML IJ SOLN
5000.0000 [IU] | Freq: Three times a day (TID) | INTRAMUSCULAR | Status: DC
  Administered 2014-04-04 – 2014-04-13 (×26): 5000 [IU] via SUBCUTANEOUS
  Filled 2014-04-04 (×30): qty 1

## 2014-04-04 MED ORDER — FAMOTIDINE IN NACL 20-0.9 MG/50ML-% IV SOLN
20.0000 mg | Freq: Two times a day (BID) | INTRAVENOUS | Status: DC
  Administered 2014-04-04: 20 mg via INTRAVENOUS
  Filled 2014-04-04 (×2): qty 50

## 2014-04-04 MED ORDER — NOREPINEPHRINE BITARTRATE 1 MG/ML IV SOLN
2.0000 ug/min | INTRAVENOUS | Status: DC
  Administered 2014-04-04: 24 ug/min via INTRAVENOUS
  Filled 2014-04-04 (×2): qty 16

## 2014-04-04 MED ORDER — SODIUM CHLORIDE 0.9 % IV BOLUS (SEPSIS)
500.0000 mL | Freq: Once | INTRAVENOUS | Status: AC
  Administered 2014-04-04: 500 mL via INTRAVENOUS

## 2014-04-04 MED ORDER — DEXTROSE 5 % IV SOLN: 1.0000 g | INTRAVENOUS | Status: DC

## 2014-04-04 MED ORDER — CHLORHEXIDINE GLUCONATE 0.12 % MT SOLN: 15.0000 mL | Freq: Two times a day (BID) | OROMUCOSAL | Status: DC

## 2014-04-04 MED ORDER — DEXTROSE 5 % IV SOLN
1.0000 g | Freq: Once | INTRAVENOUS | Status: AC
  Administered 2014-04-04: 1 g via INTRAVENOUS
  Filled 2014-04-04: qty 10

## 2014-04-04 MED ORDER — SODIUM CHLORIDE 0.9 % IV BOLUS (SEPSIS)
1000.0000 mL | Freq: Once | INTRAVENOUS | Status: AC
  Administered 2014-04-04: 1000 mL via INTRAVENOUS

## 2014-04-04 NOTE — ED Provider Notes (Signed)
CSN: [id number]     Arrival date & time 04/04/14  1537 History   First MD Initiated Contact with Patient 04/04/14 1551     Chief Complaint  Patient presents with  . Shortness of Breath   In addition to what is listed below. Patient is a 68 year old female past medical history of hypertension, asthma, and eosinophilic lung disease who presents with complaints of shortness of breath. Shortness of breath started today. Other symptoms include one-week history of productive cough of white sputum, however patient did have bouts of bright red blood with cough earlier today. EMS was contacted. They reported upon arrival patient had oxygen saturations in the 80s and was confused. She was placed on non-breather and brought to the emergency department.  (Consider location/radiation/quality/duration/timing/severity/associated sxs/prior Treatment) Patient is a 68 y.o. female presenting with shortness of breath. The history is provided by the patient, medical records and the EMS personnel. No language interpreter was used.  Shortness of Breath Severity:  Severe Onset quality:  Gradual Timing:  Constant Progression:  Worsening Chronicity:  New Relieved by:  Nothing Worsened by:  Exertion Ineffective treatments:  Inhaler Associated symptoms: chest pain (with cough), cough and hemoptysis   Associated symptoms: no abdominal pain, no sputum production, no vomiting and no wheezing   Risk factors: no recent surgery and no tobacco use     Past Medical History  Diagnosis Date  . Fibromyalgia   . Asthma   . Hypertension    Past Surgical History  Procedure Laterality Date  . Hernia repair  1970   Family History  Problem Relation Age of Onset  . Family history unknown: Yes   History  Substance Use Topics  . Smoking status: Never Smoker   . Smokeless tobacco: Not on file  . Alcohol Use: No   OB History   Grav Para Term Preterm Abortions TAB SAB Ect Mult Living                 Review of Systems   Respiratory: Positive for cough, hemoptysis and shortness of breath. Negative for sputum production and wheezing.   Cardiovascular: Positive for chest pain (with cough).  Gastrointestinal: Negative for vomiting and abdominal pain.      Allergies  Aspirin  Home Medications   Prior to Admission medications   Medication Sig Start Date End Date Taking? Authorizing Provider  albuterol (PROVENTIL HFA;VENTOLIN HFA) 108 (90 BASE) MCG/ACT inhaler Inhale 2 puffs into the lungs every 6 (six) hours as needed for wheezing.    Historical Provider, MD  atenolol (TENORMIN) 50 MG tablet Take 50 mg by mouth daily.    Historical Provider, MD  bismuth subsalicylate (KAOPECTATE) 262 MG/15ML suspension Take 15 mLs by mouth every 6 (six) hours as needed for indigestion.    Historical Provider, MD  budesonide-formoterol (SYMBICORT) 160-4.5 MCG/ACT inhaler Inhale 2 puffs into the lungs 2 (two) times daily.    Historical Provider, MD  carisoprodol (SOMA) 350 MG tablet Take 175-350 mg by mouth 2 (two) times daily.    Historical Provider, MD  cephALEXin (KEFLEX) 500 MG capsule Take 1 capsule (500 mg total) by mouth 3 (three) times daily. 07/24/13   Mariea Clonts, MD  hydrochlorothiazide (HYDRODIURIL) 25 MG tablet Take 25 mg by mouth daily.    Historical Provider, MD  HYDROcodone-acetaminophen (NORCO) 5-325 MG per tablet Take 1 tablet by mouth every 4 (four) hours as needed for pain. 07/24/13   Mariea Clonts, MD  HYDROcodone-acetaminophen (NORCO) 7.5-325 MG per tablet Take  1 tablet by mouth every 6 (six) hours as needed for pain.    Historical Provider, MD  lidocaine (LIDODERM) 5 % Place 3 patches onto the skin daily as needed (for pain). Remove & Discard patch within 12 hours or as directed by , apply to joints    Historical Provider, MD  tiZANidine (ZANAFLEX) 4 MG tablet Take 4 mg by mouth at bedtime.    Historical Provider, MD   SpO2 82% Physical Exam  Nursing note and vitals reviewed. Constitutional: She is  oriented to person, place, and time. She appears well-developed and well-nourished.  HENT:  Head: Normocephalic and atraumatic.  Right Ear: External ear normal.  Left Ear: External ear normal.  Nose: Nose normal.  Eyes: Conjunctivae are normal. Pupils are equal, round, and reactive to light.  Neck: Normal range of motion. Neck supple.  Cardiovascular: Normal rate and regular rhythm.   Pulmonary/Chest: She is in respiratory distress (mild with diffuse but R>L rhonchi). She exhibits no tenderness.  Abdominal: Soft. Bowel sounds are normal.  Musculoskeletal: Normal range of motion. She exhibits no edema and no tenderness.  Neurological: She is alert and oriented to person, place, and time. No cranial nerve deficit.  Moving all extermities  Skin: Skin is warm and dry.  Psychiatric: She has a normal mood and affect.    ED Course  Procedures (including critical care time) Labs Review Labs Reviewed - No data to display  Imaging Review No results found.   EKG Interpretation   Date/Time:  Saturday Apr 04 2014 15:48:44 EDT Ventricular Rate:  84 PR Interval:  193 QRS Duration: 148 QT Interval:  384 QTC Calculation: 544 R Axis:   -48 Text Interpretation:  Sinus rhythm RBBB and LAFB `no acute st/t changes  compared to prior ecgs Confirmed by Ashok Cordia  MD, Lennette Bihari ([id number]) on 04/04/2014  3:58:16 PM        Date: 04/04/2014  Rate: 84  Rhythm: normal sinus rhythm  QRS Axis: normal  Intervals: normal  ST/T Wave abnormalities: T wave inversions in III and AVf  Conduction Disutrbances:right bundle branch block  Narrative Interpretation:   Old EKG Reviewed: none available     MDM   Final diagnoses:  Community acquired pneumonia  Hypoxia  Renal failure    Patient presents to the ED with worsening cough and SOB.  VS remarkable for hypoxia on RA which improved to >90% on 4 L [place].  PE as above and remarkable for R>L rhonchi but patient alert and oriented with no focal neuro deficits.   CXR revealed right sided opacities concerning for PNA versus asymmetrical edema.  She was given ceftriaxone and azithro CAP coverage.  EKG shows non specific T wave changes.  Labs remarkable for ABG with values c/w respiratory acidosis and acute renal failure.  IVF given and patient felt to warrant admission.  While in the ED patient noted to have intermittent bouts of hypotension that would respond to IVF.  Given labile BPs and potential for quick deterioration it was felt that ICU admission was needed.  Patient admitted without acute events.     Corlis Leak, MD 04/05/14 [phone]

## 2014-04-04 NOTE — ED Notes (Signed)
Per family, pt primarily uses a wheelchair; pressure ulcers x 3 noted on buttocks; pt keeps wounds covered with bandaids

## 2014-04-04 NOTE — H&P (Signed)
PULMONARY / CRITICAL CARE MEDICINE   Name: Shirley Schneider MRN: [id number] DOB: 05-29-1946    ADMISSION DATE:  04/04/2014  PRIMARY SERVICE: PCCM  CHIEF COMPLAINT:  Right sided chest pain, SOB, pink frothy sputum.  BRIEF PATIENT DESCRIPTION:  67 years old female with PMH relevant for asthma, HTN and fibromyalgia. 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 Presents with SOB and cough productive of pink frothy sputum. Found hypoxic to the low 80's on RA, hypotensive requiring pressors. Chest X ray with bilateral basilar predominant infiltrates R>L that likely represent pulmonary edema but cannot rule out infection.  SIGNIFICANT EVENTS / STUDIES:    LINES / TUBES: - Peripheral IV's  CULTURES: - Blood cultures ordered - Urine culture ordered - Sputum culture ordered  ANTIBIOTICS: - Rocephin - Azithromycin  HISTORY OF PRESENT ILLNESS:   68 years old female with PMH relevant for asthma, HTN and fibromyalgia. 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 She is a lifetime non smoker and is currently on Symbicort and PRN Albuterol. She presents today with one day history of right sided chest pain, SOB and cough productive of pink frothy sputum. She denies fever, chills, wheezing, abdominal or urinary symptoms. At admission to the ED she was found to be hypoxic to the low 80's with bilateral crackles and pink frothy sputum. Blood work revealed a normal WBC, no peripheral eosinophilia, normal hgb, lactate of 2.6 and elevated creatinine to 2.28 (baseline 0.68 2012), BNP of 6040. ABG showed 7.24/46/59/20/85%. She was also found to be hypotensive despite IVF resuscitation and was started on Levophed. Of note, she has an echocardiogram from 01/30/2008 with normal LVEF.   PAST MEDICAL HISTORY :  Past Medical History  Diagnosis Date  . Fibromyalgia   . Asthma   . Hypertension    Past  Surgical History  Procedure Laterality Date  . Hernia repair  1970   Prior to Admission medications   Medication Sig Start Date End Date Taking? Authorizing Provider  albuterol (PROVENTIL HFA;VENTOLIN HFA) 108 (90 BASE) MCG/ACT inhaler Inhale 2 puffs into the lungs every 6 (six) hours as needed for wheezing.   Yes Historical Provider, MD  atenolol (TENORMIN) 50 MG tablet Take 50 mg by mouth daily.   Yes Historical Provider, MD  budesonide-formoterol (SYMBICORT) 160-4.5 MCG/ACT inhaler Inhale 2 puffs into the lungs 2 (two) times daily.   Yes Historical Provider, MD  diclofenac sodium (VOLTAREN) 1 % GEL Apply 1 application topically 4 (four) times daily. Apply to knees   Yes Historical Provider, MD  hydrochlorothiazide (HYDRODIURIL) 25 MG tablet Take 25 mg by mouth daily.   Yes Historical Provider, MD  HYDROcodone-acetaminophen (NORCO) 7.5-325 MG per tablet Take 1 tablet by mouth every 6 (six) hours as needed for pain.   Yes Historical Provider, MD  tiZANidine (ZANAFLEX) 4 MG tablet Take 4 mg by mouth at bedtime.   Yes Historical Provider, MD   Allergies  Allergen Reactions  . Aspirin     upstomach    FAMILY HISTORY:  History reviewed. No pertinent family history. SOCIAL HISTORY:  reports that she has never smoked. She does not have any smokeless tobacco history on file. She reports that she does not drink alcohol or use illicit drugs.  REVIEW OF SYSTEMS:   All systems reviewed and found negative except for what I mentioned in the HPI.   SUBJECTIVE:   VITAL  SIGNS: Temp:  [97.4 F (36.3 C)-99.8 F (37.7 C)] 97.4 F (36.3 C) (05/30 1901) Pulse Rate:  [58-86] 58 (05/30 1956) Resp:  [11-26] 24 (05/30 1956) BP: (68-106)/(0-83) 106/68 mmHg (05/30 1949) SpO2:  [82 %-100 %] 100 % (05/30 1956) FiO2 (%):  [40 %] 40 % (05/30 1956) HEMODYNAMICS:   VENTILATOR SETTINGS: Vent Mode:  [-]  FiO2 (%):  [40 %] 40 % INTAKE / OUTPUT: Intake/Output     05/30 0701 - 05/31 0700   I.V. 1500    Total Intake 1500   Net +1500         PHYSICAL EXAMINATION: General: Pleasant female patient in mild respiratory distress. Eyes: Anicteric sclerae. ENT: Oropharynx clear. Dry mucous membranes. No thrush Lymph: No cervical, supraclavicular, or axillary lymphadenopathy. Heart: Normal S1, S2. No murmurs, rubs, or gallops appreciated. No bruits, equal pulses. Lungs: Bilateral crackles predominantly to the bases R>L, no wheezes. Normal upper airway sounds without evidence of stridor. Abdomen: Abdomen soft, non-tender and not distended, normoactive bowel sounds. No hepatosplenomegaly or masses. Musculoskeletal: No clubbing or synovitis. Mild bilateral LE edema. Skin: No rashes or lesions Neuro: No focal neurologic deficits.  LABS:  CBC  Recent Labs Lab 04/04/14 1605  WBC 7.0  HGB 12.4  HCT 41.9  PLT 298   Coag's No results found for this basename: APTT, INR,  in the last 168 hours BMET  Recent Labs Lab 04/04/14 1605  NA 133*  K 5.4*  CL 100  CO2 21  BUN 39*  CREATININE 2.28*  GLUCOSE 91   Electrolytes  Recent Labs Lab 04/04/14 1605  CALCIUM 9.2   Sepsis Markers  Recent Labs Lab 04/04/14 1846 04/04/14 1855  LATICACIDVEN 2.36* 2.6*   ABG  Recent Labs Lab 04/04/14 1624  PHART 7.247*  PCO2ART 46.5*  PO2ART 59.0*   Liver Enzymes  Recent Labs Lab 04/04/14 1605  AST 16  ALT 5  ALKPHOS 104  BILITOT 0.3  ALBUMIN 3.7   Cardiac Enzymes  Recent Labs Lab 04/04/14 1605  TROPONINI <0.30  PROBNP 6040.0*   Glucose No results found for this basename: GLUCAP,  in the last 168 hours  Imaging Dg Chest Portable 1 View  04/04/2014   CLINICAL DATA:  SOB  EXAM: PORTABLE CHEST - 1 VIEW  COMPARISON:  None.  FINDINGS: Normal cardiac silhouette. Patient rotated rightward. There is airspace disease in the right lower lobe and right upper lobe. Left lung is relatively clear.  IMPRESSION: Right upper and lower lobe airspace disease with differential including  multifocal pneumonia versus asymmetric pulmonary edema.   Electronically Signed   By: Suzy Bouchard M.D.   On: 04/04/2014 16:08     CXR:  IMPRESSION:  Right upper and lower lobe airspace disease with differential  including multifocal pneumonia versus asymmetric pulmonary edema.   ASSESSMENT / PLAN:  PULMONARY A: 1) Acute hypoxic and hypercarbic respiratory failure 2) Unclear etiology, likely pulmonary edema given high BNP, pink frothy sputum. Bed side echo personally done with very poor acoustic window but I do get the impression that her LVEF is depressed. Cannot rule out infection, specially given asymmetric lung infiltrates. Against infection are no fever and normal WBC. No peripheral eosinophilia on CBC,   P:   - We will admit to the ICU - Start BiPAP 10/5, 40% FiO2 - Rocephin and azithromycin to cover for possible CAP - Albuterol PRN - Will follow ABG after 1 hr of BiPAP  CARDIOVASCULAR A:  1) Refractory hypotension. In the differential CHF  vs septic shock 2) Mild lactic acidosis 3) On Hydralazine and Atenolol at home  P:  - Will continue levophed to goal MAP of 65 - Will place CVC and arterial line. - Will get CVP, central venous sat - Will get echocardiogram - Unable to diurese because of hypotension.  - Will follow lactic acid - We will hold atenolol - Consider Cardiology consult  RENAL A:   1) Acute renal failure  (last creatinine on record was normal in 2012), likely pre renal from sepsis vs CHF, possibly overdiuresis. 2) Mild hyperkalemia 2 P:   - Will repeat labs after IVF's administered in the ED.   GASTROINTESTINAL A:   1) No issues P:   - IV Pepcid - NPO  HEMATOLOGIC A:   1) No acute issues  P:  - DVT prophylaxis with SQ heparin  INFECTIOUS A:   1) Possible septic shock secondary to CAP P:   - Will treat with Rocephin and Azithromycin - Will follow cultures  ENDOCRINE A:   1) No history of diabetes 2) No recent use of  prednisone P:    NEUROLOGIC A:   1) Somnolent but easily arousable. Non focal P:     I have personally obtained a history, examined the patient, evaluated laboratory and imaging results, formulated the assessment and plan and placed orders. CRITICAL CARE: The patient is critically ill with multiple organ systems failure and requires high complexity decision making for assessment and support, frequent evaluation and titration of therapies, application of advanced monitoring technologies and extensive interpretation of multiple databases. Critical Care Time devoted to patient care services described in this note is 60 minutes.   Waynetta Pean, MD Pulmonary [facility] [place] Pager: [phone]  04/04/2014, 7:57 PM

## 2014-04-04 NOTE — Progress Notes (Signed)
Pt transported from ED to 2H08 on servo-i noninvasive. No complications noted.

## 2014-04-04 NOTE — ED Notes (Signed)
Pt c/o of increased chest pain during respiration; sts pain feels worse than initially on arrival. Pt A&O x 3; BP 72/50 automatic. Manual BP by this nurse 68 palpated. Dr.Kunz at bedside for re-evaluation

## 2014-04-04 NOTE — Procedures (Signed)
Arterial Catheter Insertion Procedure Note MIZANI DILDAY [id number] Jan 02, 1946  Procedure: Insertion of Arterial Catheter  Indications: Blood pressure monitoring  Procedure Details Consent: Risks of procedure as well as the alternatives and risks of each were explained to the (patient/caregiver).  Consent for procedure obtained. Time Out: Verified patient identification, verified procedure, site/side was marked, verified correct patient position, special equipment/implants available, medications/allergies/relevent history reviewed, required imaging and test results available.  Performed  Maximum sterile technique was used including antiseptics, cap, gloves, gown, hand hygiene, mask and sheet. Skin prep: Chlorhexidine; local anesthetic administered 20 gauge catheter was inserted into left radial artery using the Seldinger technique.  Evaluation Blood flow good; BP tracing good. Complications: No apparent complications.   Rozelle Logan [facility] 04/04/2014

## 2014-04-04 NOTE — ED Notes (Signed)
Dr.Kunz at bedside for plan of care

## 2014-04-04 NOTE — ED Notes (Addendum)
Informed provider that pt's BP in still in the 70's and pt is almost finished with the fluid bolus. Orders given for Levo, instructed Dr. Fredric Dine to reassess pt after fluid bolus is complete.

## 2014-04-04 NOTE — ED Notes (Signed)
Dr. Kunz at bedside.  

## 2014-04-04 NOTE — ED Notes (Signed)
Per critical care, place patient on BiPap. Respiratory called.

## 2014-04-04 NOTE — ED Notes (Signed)
NTS performed via Right Nare with 93fr catheter.  Lubrication was used and catheter passed trough nare without complication nor with force.  Moderate amt of moderate amt of thick, tan secretions were returned.  Site was left in tact without complication.  Pt tolerated procedure well and states that she feels like she can breathe a little easier.

## 2014-04-04 NOTE — ED Notes (Addendum)
Pt to ED via [facility] from home for shortness of breath. Per EMS, family reported one episode of emesis that was red in color. Fire dept had initial O2 sats in the mid and lower 80s; O2 sats 97% on NRB.

## 2014-04-04 NOTE — ED Notes (Signed)
Critical Care MD at bedside.  

## 2014-04-04 NOTE — Progress Notes (Addendum)
Pt placed on BiPAP; IPAP 10, EPAP 5, 40% FIO2 per sats of 100%. Pt has a medium full face mask. Pt VT are 534. Pt is comfortable and resting at this time.

## 2014-04-05 ENCOUNTER — Inpatient Hospital Stay (HOSPITAL_COMMUNITY): Payer: Medicare Other

## 2014-04-05 DIAGNOSIS — I509 Heart failure, unspecified: Secondary | ICD-10-CM

## 2014-04-05 DIAGNOSIS — J9600 Acute respiratory failure, unspecified whether with hypoxia or hypercapnia: Secondary | ICD-10-CM

## 2014-04-05 DIAGNOSIS — R579 Shock, unspecified: Secondary | ICD-10-CM

## 2014-04-05 DIAGNOSIS — J9601 Acute respiratory failure with hypoxia: Secondary | ICD-10-CM

## 2014-04-05 DIAGNOSIS — J189 Pneumonia, unspecified organism: Secondary | ICD-10-CM

## 2014-04-05 LAB — BASIC METABOLIC PANEL
BUN: 42 mg/dL — ABNORMAL HIGH (ref 6–23)
BUN: 40 mg/dL — ABNORMAL HIGH (ref 6–23)
CO2: 18 mEq/L — ABNORMAL LOW (ref 19–32)
CO2: 17 meq/L — AB (ref 19–32)
Calcium: 8.2 mg/dL — ABNORMAL LOW (ref 8.4–10.5)
Calcium: 8.7 mg/dL (ref 8.4–10.5)
Chloride: 102 mEq/L (ref 96–112)
Chloride: 104 mEq/L (ref 96–112)
Creatinine, Ser: 2.33 mg/dL — ABNORMAL HIGH (ref 0.50–1.10)
Creatinine, Ser: 1.71 mg/dL — ABNORMAL HIGH (ref 0.50–1.10)
GFR calc Af Amer: 24 mL/min — ABNORMAL LOW (ref >90)
GFR calc Af Amer: 35 mL/min — ABNORMAL LOW (ref >90)
GFR calc non Af Amer: 20 mL/min — ABNORMAL LOW (ref >90)
GFR calc non Af Amer: 30 mL/min — ABNORMAL LOW (ref >90)
GLUCOSE: 77 mg/dL (ref 70–99)
Glucose, Bld: 86 mg/dL (ref 70–99)
Potassium: 6.1 mEq/L — ABNORMAL HIGH (ref 3.7–5.3)
Potassium: 3.9 mEq/L (ref 3.7–5.3)
Sodium: 135 mEq/L — ABNORMAL LOW (ref 137–147)
Sodium: 139 mEq/L (ref 137–147)

## 2014-04-05 LAB — POCT I-STAT 3, ART BLOOD GAS (G3+)
ACID-BASE DEFICIT: 10 mmol/L — AB (ref 0.0–2.0)
ACID-BASE DEFICIT: 9 mmol/L — AB (ref 0.0–2.0)
Acid-base deficit: 9 mmol/L — ABNORMAL HIGH (ref 0.0–2.0)
BICARBONATE: 16.6 meq/L — AB (ref 20.0–24.0)
BICARBONATE: 15.7 meq/L — AB (ref 20.0–24.0)
Bicarbonate: 19.7 mEq/L — ABNORMAL LOW (ref 20.0–24.0)
O2 SAT: 93 %
O2 SAT: 93 %
O2 SAT: 97 %
PCO2 ART: 54.8 mmHg — AB (ref 35.0–45.0)
PO2 ART: 86 mmHg (ref 80.0–100.0)
PO2 ART: 90 mmHg (ref 80.0–100.0)
Patient temperature: 98.1
Patient temperature: 98.1
Patient temperature: 98.6
TCO2: 21 mmol/L (ref 0–100)
TCO2: 18 mmol/L (ref 0–100)
TCO2: 17 mmol/L (ref 0–100)
pCO2 arterial: 37.1 mmHg (ref 35.0–45.0)
pCO2 arterial: 28.6 mmHg — ABNORMAL LOW (ref 35.0–45.0)
pH, Arterial: 7.163 — CL (ref 7.350–7.450)
pH, Arterial: 7.257 — ABNORMAL LOW (ref 7.350–7.450)
pH, Arterial: 7.348 — ABNORMAL LOW (ref 7.350–7.450)
pO2, Arterial: 76 mmHg — ABNORMAL LOW (ref 80.0–100.0)

## 2014-04-05 LAB — URINALYSIS, ROUTINE W REFLEX MICROSCOPIC
Bilirubin Urine: NEGATIVE
Glucose, UA: NEGATIVE mg/dL
KETONES UR: NEGATIVE mg/dL
LEUKOCYTES UA: NEGATIVE
NITRITE: NEGATIVE
PH: 5 (ref 5.0–8.0)
Protein, ur: 30 mg/dL — AB
SPECIFIC GRAVITY, URINE: 1.02 (ref 1.005–1.030)
Urobilinogen, UA: 0.2 mg/dL (ref 0.0–1.0)

## 2014-04-05 LAB — CBC
HEMATOCRIT: 37.6 % (ref 36.0–46.0)
Hemoglobin: 11.5 g/dL — ABNORMAL LOW (ref 12.0–15.0)
MCH: 29.2 pg (ref 26.0–34.0)
MCHC: 30.6 g/dL (ref 30.0–36.0)
MCV: 95.4 fL (ref 78.0–100.0)
PLATELETS: 337 10*3/uL (ref 150–400)
RBC: 3.94 MIL/uL (ref 3.87–5.11)
RDW: 15.4 % (ref 11.5–15.5)
WBC: 14.9 10*3/uL — AB (ref 4.0–10.5)

## 2014-04-05 LAB — URINE MICROSCOPIC-ADD ON

## 2014-04-05 LAB — CORTISOL: Cortisol, Plasma: 21.5 ug/dL

## 2014-04-05 LAB — LACTIC ACID, PLASMA: LACTIC ACID, VENOUS: 1.4 mmol/L (ref 0.5–2.2)

## 2014-04-05 LAB — STREP PNEUMONIAE URINARY ANTIGEN: STREP PNEUMO URINARY ANTIGEN: NEGATIVE

## 2014-04-05 LAB — TROPONIN I
TROPONIN I: 0.69 ng/mL — AB (ref <0.30)
Troponin I: 0.77 ng/mL (ref <0.30)

## 2014-04-05 LAB — PHOSPHORUS: Phosphorus: 4.9 mg/dL — ABNORMAL HIGH (ref 2.3–4.6)

## 2014-04-05 LAB — MAGNESIUM: MAGNESIUM: 1.8 mg/dL (ref 1.5–2.5)

## 2014-04-05 MED ORDER — PANTOPRAZOLE SODIUM 40 MG IV SOLR
40.0000 mg | Freq: Every day | INTRAVENOUS | Status: DC
  Administered 2014-04-05 – 2014-04-10 (×6): 40 mg via INTRAVENOUS
  Filled 2014-04-05 (×6): qty 40

## 2014-04-05 MED ORDER — ROCURONIUM BROMIDE 50 MG/5ML IV SOLN
INTRAVENOUS | Status: AC
  Filled 2014-04-05: qty 2

## 2014-04-05 MED ORDER — SODIUM CHLORIDE 0.9 % IV SOLN
1500.0000 mg | INTRAVENOUS | Status: DC
  Administered 2014-04-05: 1500 mg via INTRAVENOUS
  Filled 2014-04-05: qty 1500

## 2014-04-05 MED ORDER — ETOMIDATE 2 MG/ML IV SOLN
INTRAVENOUS | Status: AC
  Administered 2014-04-05: 20 mg
  Filled 2014-04-05: qty 20

## 2014-04-05 MED ORDER — VANCOMYCIN HCL 10 G IV SOLR
1500.0000 mg | INTRAVENOUS | Status: DC
  Administered 2014-04-06 – 2014-04-07 (×2): 1500 mg via INTRAVENOUS
  Filled 2014-04-05 (×2): qty 1500

## 2014-04-05 MED ORDER — SUCCINYLCHOLINE CHLORIDE 20 MG/ML IJ SOLN
INTRAMUSCULAR | Status: AC
  Administered 2014-04-05: 100 mg
  Filled 2014-04-05: qty 1

## 2014-04-05 MED ORDER — FENTANYL CITRATE 0.05 MG/ML IJ SOLN: 50.0000 ug | INTRAMUSCULAR | Status: DC | PRN

## 2014-04-05 MED ORDER — SODIUM POLYSTYRENE SULFONATE 15 GM/60ML PO SUSP
30.0000 g | Freq: Once | ORAL | Status: AC
  Administered 2014-04-05: 30 g
  Filled 2014-04-05: qty 120

## 2014-04-05 MED ORDER — BIOTENE DRY MOUTH MT LIQD
15.0000 mL | Freq: Four times a day (QID) | OROMUCOSAL | Status: DC
  Administered 2014-04-05 – 2014-04-10 (×22): 15 mL via OROMUCOSAL

## 2014-04-05 MED ORDER — FENTANYL CITRATE 0.05 MG/ML IJ SOLN
INTRAMUSCULAR | Status: AC
  Filled 2014-04-05: qty 2

## 2014-04-05 MED ORDER — ALBUTEROL SULFATE (2.5 MG/3ML) 0.083% IN NEBU
2.5000 mg | INHALATION_SOLUTION | RESPIRATORY_TRACT | Status: DC | PRN
  Administered 2014-04-09 – 2014-04-10 (×3): 2.5 mg via RESPIRATORY_TRACT
  Filled 2014-04-05 (×3): qty 3

## 2014-04-05 MED ORDER — LIDOCAINE HCL (CARDIAC) 20 MG/ML IV SOLN
INTRAVENOUS | Status: AC
  Filled 2014-04-05: qty 5

## 2014-04-05 MED ORDER — FENTANYL CITRATE 0.05 MG/ML IJ SOLN
100.0000 ug | Freq: Once | INTRAMUSCULAR | Status: AC
  Administered 2014-04-05: 100 ug via INTRAVENOUS

## 2014-04-05 MED ORDER — SODIUM CHLORIDE 0.9 % IV SOLN
0.0000 mg/h | INTRAVENOUS | Status: DC
  Administered 2014-04-05: 2 mg/h via INTRAVENOUS
  Filled 2014-04-05: qty 10

## 2014-04-05 MED ORDER — PIPERACILLIN-TAZOBACTAM 3.375 G IVPB
3.3750 g | Freq: Three times a day (TID) | INTRAVENOUS | Status: DC
  Administered 2014-04-05 – 2014-04-08 (×9): 3.375 g via INTRAVENOUS
  Filled 2014-04-05 (×13): qty 50

## 2014-04-05 MED ORDER — SODIUM CHLORIDE 0.9 % IV SOLN
0.0000 mg/h | INTRAVENOUS | Status: DC
  Administered 2014-04-05 – 2014-04-06 (×2): 4 mg/h via INTRAVENOUS
  Filled 2014-04-05 (×3): qty 10

## 2014-04-05 MED ORDER — CHLORHEXIDINE GLUCONATE 0.12 % MT SOLN
15.0000 mL | Freq: Two times a day (BID) | OROMUCOSAL | Status: DC
  Administered 2014-04-05 – 2014-04-10 (×11): 15 mL via OROMUCOSAL
  Filled 2014-04-05 (×10): qty 15

## 2014-04-05 MED ORDER — FENTANYL CITRATE 0.05 MG/ML IJ SOLN
50.0000 ug | INTRAMUSCULAR | Status: DC | PRN
  Administered 2014-04-05 – 2014-04-11 (×13): 50 ug via INTRAVENOUS
  Filled 2014-04-05 (×13): qty 2

## 2014-04-05 NOTE — Progress Notes (Signed)
PULMONARY / CRITICAL CARE MEDICINE   Name: Shirley Schneider MRN: [id number] DOB: [birth year]    ADMISSION DATE:  04/04/2014  PRIMARY SERVICE: PCCM  CHIEF COMPLAINT:  Right sided chest pain, SOB, pink frothy sputum.  BRIEF PATIENT DESCRIPTION:   68 years old female with PMH relevant for asthma, HTN and fibromyalgia. 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 She is a lifetime non smoker and is currently on Symbicort and PRN Albuterol. She presents today with one day history of right sided chest pain, SOB and cough productive of pink frothy sputum. She denies fever, chills, wheezing, abdominal or urinary symptoms. At admission to the ED she was found to be hypoxic to the low 80's with bilateral crackles and pink frothy sputum. Blood work revealed a normal WBC, no peripheral eosinophilia, normal hgb, lactate of 2.6 and elevated creatinine to 2.28 (baseline 0.68 2012), BNP of 6040. ABG showed 7.24/46/59/20/85%. She was also found to be hypotensive despite IVF resuscitation and was started on Levophed. Of note, she has an echocardiogram from 01/30/2008 with normal LVEF.    SIGNIFICANT EVENTS / STUDIES:  04/04/2014 -04/04/2014 - Admit    LINES / TUBES: - Peripheral IV's  CULTURES: - Blood cultures ordered - Urine culture ordered - Sputum culture ordered - resp virus 04/05/14 - urine leg 04/05/14 - urine strep 04/05/14  ANTIBIOTICS: - Rocephin - Azithromycin  SUBJECTIVE:   04/05/14: Prn fentanyl and versed gtt. Off sedation gtt: normal WUA. RASS 0. On vent: fio2 40%, On pressors: 44mcg. Making urine  VITAL SIGNS: Temp:  [97.4 F (36.3 C)-100.9 F (38.3 C)] 100.9 F (38.3 C) (05/31 0821) Pulse Rate:  [58-147] 75 (05/31 0800) Resp:  [10-26] 26 (05/31 1100) BP: (68-155)/(0-104) 155/83 mmHg (05/31 0305) SpO2:  [82 %-100 %] 100 % (05/31 1100) Arterial Line BP: (74-186)/(48-98) 77/63 mmHg (05/31  1100) FiO2 (%):  [40 %] 40 % (05/31 0852) Weight:  [109.7 kg (241 lb 13.5 oz)] 109.7 kg (241 lb 13.5 oz) (05/31 0211) HEMODYNAMICS: CVP:  [9 mmHg-18 mmHg] 9 mmHg VENTILATOR SETTINGS: Vent Mode:  [-] PRVC FiO2 (%):  [40 %] 40 % Set Rate:  [10 bmp-26 bmp] 26 bmp Vt Set:  [420 mL-500 mL] 500 mL PEEP:  [5 cmH20] 5 cmH20 Plateau Pressure:  [22 cmH20-23 cmH20] 23 cmH20 INTAKE / OUTPUT: Intake/Output     05/30 0701 - 05/31 0700 05/31 0701 - 06/01 0700   I.V. (mL/kg) 1800.6 (16.4) 77.4 (0.7)   IV Piggyback 600    Total Intake(mL/kg) 2400.6 (21.9) 77.4 (0.7)   Urine (mL/kg/hr) 610 315 (0.6)   Total Output 610 315   Net +1790.6 -237.7          PHYSICAL EXAMINATION: General: Intubated.  Eyes: Anicteric sclerae. ENT: Oropharynx clear. Dry mucous membranes. No thrush Lymph: No cervical, supraclavicular, or axillary lymphadenopathy. Heart: Normal S1, S2. No murmurs, rubs, or gallops appreciated. No bruits, equal pulses. Lungs: Bilateral crackles predominantly to the bases R>L, no wheezes. Normal upper airway sounds without evidence of stridor. Abdomen: Abdomen soft, non-tender and not distended, normoactive bowel sounds. No hepatosplenomegaly or masses. Musculoskeletal: No clubbing or synovitis. Mild bilateral LE edema. Skin: No rashes or lesions Neuro: No focal neurologic deficits. RASS 0 on sedation gtt. Nods appropriately  LABS:   PULMONARY  Recent Labs Lab 04/04/14 1624 04/04/14 2240 04/05/14 0100 04/05/14 0253 04/05/14 0525  PHART 7.247* 7.205* 7.163* 7.257* 7.348*  PCO2ART  46.5* 48.4* 54.8* 37.1 28.6*  PO2ART 59.0* 93.4 86.0 76.0* 90.0  HCO3 20.2 18.4* 19.7* 16.6* 15.7*  TCO2 22 19.9 21 18 17   O2SAT 85.0 67.3  98.4 93.0 93.0 97.0    CBC  Recent Labs Lab 04/04/14 1605 04/04/14 2022 04/05/14 0200  HGB 12.4 12.3 11.5*  HCT 41.9 40.5 37.6  WBC 7.0 9.5 14.9*  PLT 298 319 337    COAGULATION No results found for this basename: INR,  in the last 168  hours  CARDIAC   Recent Labs Lab 04/04/14 1605 04/04/14 2022 04/05/14 0200 04/05/14 0750  TROPONINI <0.30 0.45* 0.69* 0.77*    Recent Labs Lab 04/04/14 1605  PROBNP 6040.0*     CHEMISTRY  Recent Labs Lab 04/04/14 1605 04/04/14 2022 04/05/14 0200  NA 133* 137 135*  K 5.4* 5.0 6.1*  CL 100 102 102  CO2 21 17* 18*  GLUCOSE 91 85 86  BUN 39* 38* 42*  CREATININE 2.28* 2.17* 2.33*  CALCIUM 9.2 8.6 8.2*  MG  --  1.8 1.8  PHOS  --  4.2 4.9*   Estimated Creatinine Clearance: 27.9 ml/min (by C-G formula based on Cr of 2.33).   LIVER  Recent Labs Lab 04/04/14 1605  AST 16  ALT 5  ALKPHOS 104  BILITOT 0.3  PROT 7.6  ALBUMIN 3.7     INFECTIOUS  Recent Labs Lab 04/04/14 1846 04/04/14 1855 04/04/14 2022  LATICACIDVEN 2.36* 2.6* 3.0*     ENDOCRINE CBG (last 3)  No results found for this basename: GLUCAP,  in the last 72 hours       IMAGING x48h  Dg Chest Port 1 View  04/05/2014   CLINICAL DATA:  Check endotracheal tube position  EXAM: PORTABLE CHEST - 1 VIEW  COMPARISON:  04/04/2014  FINDINGS: An endotracheal tube is been placed in the interval. It lies 18 mm above the carina. The nasogastric catheter is within normal limits. The right jugular central line is again seen and stable. Increasing infiltrative density is noted in the right lung base. The left lung remains predominantly clear. The overall inspiratory effort is poor.  IMPRESSION: Increasing right basilar infiltrate.  Tubes and lines as described.   Electronically Signed   By: Inez Catalina M.D.   On: 04/05/2014 07:38   Dg Chest Port 1 View  04/04/2014   CLINICAL DATA:  Central line placement  EXAM: PORTABLE CHEST - 1 VIEW  COMPARISON:  04/04/2014 at 3:51 p.m.  FINDINGS: New right IJ central line. Cardiomediastinal contours are significantly distorted by rightward rotation, but the tip projects at the expected level of the right atrium.  No change in upper mediastinal contours, evidence of  hemothorax, or pneumothorax. Airspace disease asymmetric to the right base, unchanged from prior.  IMPRESSION: 1. New right IJ central line, tip at the right atrium. No pneumothorax. 2. Unchanged right basilar airspace disease.   Electronically Signed   By: Jorje Guild M.D.   On: 04/04/2014 22:03   Dg Chest Portable 1 View  04/04/2014   CLINICAL DATA:  SOB  EXAM: PORTABLE CHEST - 1 VIEW  COMPARISON:  None.  FINDINGS: Normal cardiac silhouette. Patient rotated rightward. There is airspace disease in the right lower lobe and right upper lobe. Left lung is relatively clear.  IMPRESSION: Right upper and lower lobe airspace disease with differential including multifocal pneumonia versus asymmetric pulmonary edema.   Electronically Signed   By: Suzy Bouchard M.D.   On: 04/04/2014 16:08  ASSESSMENT / PLAN:  PULMONARY A: 1) Acute hypoxic and hypercarbic respiratory failure - intubated 2) DDx - CAP, Acute CHF NOS, Eosinophilic pna (peripheral eos normal)  P:   - Full vent support - Get CT chest - Might need Bronch BAL for eos if no microbe identified  CARDIOVASCULAR A:  1) Circulatory shock In the differential CHF vs septic shock; COox 67 makes cardiogenic less likely 2) Likely type 2 stress NSTEMI  P:  - Will continue levophed to goal MAP of 65 -- Will get echocardiogram and consider cards consult depending on results  RENAL A:   1) Acute renal failure  (last creatinine on record was normal in 2012), likely pre renal from sepsis vs CHF, possibly overdiuresis. 2) Mild hyperkalemia - s/p kayexalate  P:   - track bmet closetly; recheck 3pm  -   GASTROINTESTINAL A:   1) No issues P:   - IV Pepcid - NPO - consider TF if not extubated 04/06/14   HEMATOLOGIC A:   1) No acute issues  P:  - DVT prophylaxis with SQ heparin  INFECTIOUS A:   1) Possible septic shock secondary to CAP P:   - Rocephin and Azithromycin - Will follow cultures - check resp virus  pane;= - check PCT  - check urine leg and strep  ENDOCRINE A:   1) No history of diabetes 2) No recent use of prednisone P:    NEUROLOGIC A:   1) Somnolent but easily arousable. Non focal P:   Dc versed gtt Use fent prn  GLOBAL 03/08/14: Family updated   The patient is critically ill with multiple organ systems failure and requires high complexity decision making for assessment and support, frequent evaluation and titration of therapies, application of advanced monitoring technologies and extensive interpretation of multiple databases.   Critical Care Time devoted to patient care services described in this note is  35  Minutes.  Dr. Brand Males, M.D., [facility].C.P Pulmonary and Critical Care Medicine Staff Physician [facility] Pager: [phone], If no answer or between  15:00h - 7:00h: call [phone]  04/05/2014 11:53 AM

## 2014-04-05 NOTE — Progress Notes (Addendum)
ANTIBIOTIC CONSULT NOTE - INITIAL  Pharmacy Consult for Vancomycin/Zosyn Indication: rule out sepsis  Allergies  Allergen Reactions  . Aspirin     upstomach   Patient Measurements: Height: 5\' 3"  (160 cm) Weight: 241 lb 13.5 oz (109.7 kg) IBW/kg (Calculated) : 52.4  Vital Signs: Temp: 98.1 F (36.7 C) (05/31 0000) Temp src: Axillary (05/31 0000) BP: 131/86 mmHg (05/31 0100) Pulse Rate: 147 (05/31 0100)  Labs:  Recent Labs  04/04/14 1605 04/04/14 2022  WBC 7.0 9.5  HGB 12.4 12.3  PLT 298 319  CREATININE 2.28* 2.17*   Estimated Creatinine Clearance: 29.9 ml/min (by C-G formula based on Cr of 2.17).  Medical History: Past Medical History  Diagnosis Date  . Fibromyalgia   . Asthma   . Hypertension    Assessment: 69 y/o F here with likely pulmonary edema but cannot r/o infection so starting broad spectrum antibiotics. WBC WNL, Scr 2.17 with CrCl ~20-30, afebrile, other labs as above.   Goal of Therapy:  Vancomycin trough level 15-20 mcg/ml  Plan:  -Vancomycin 1500 mg IV q48h -Zosyn 3.375G IV q8h to be infused over 4 hours -Trend WBC, temp, renal function  -Drug levels as indicated  -De-escalate as able  Narda Bonds 04/05/2014,1:59 AM  **Renal function improved to >30 ml/min, vancomycin dose changed to reflect this improvement  Harolyn Rutherford, PharmD Clinical Pharmacist - Resident Pager: [phone] Pharmacy: [phone] 04/05/2014 4:36 PM

## 2014-04-05 NOTE — ED Provider Notes (Signed)
I saw and evaluated the patient, reviewed the resident's note and I agree with the findings and plan.   EKG Interpretation   Date/Time:  Saturday Apr 04 2014 15:48:44 EDT Ventricular Rate:  84 PR Interval:  193 QRS Duration: 148 QT Interval:  384 QTC Calculation: 626 R Axis:   -48 Text Interpretation:  Sinus rhythm RBBB and LAFB `no acute st/t changes  compared to prior ecgs Confirmed by Hatsuko Bizzarro  MD, Lennette Bihari ([id number]) on 04/04/2014  3:58:16 PM      Pt w generalized weakness, felt faint/dizzy. States symptoms started about a week ago, slowly progressive since. C/o increasing cough in past week, ?fever. Pt v weak, faint voice, very limited historian - level 5 caveat.   Pt hypotensive pta, responded to ivf, and initial ED bp improved.  Labs. Cxr. Iv ns bolus.  On frequent recheck vitals, bp low/hypotensive. 2nd iv lines, additional ns boluses.  Infiltrate on cxr, iv abx.   Critical care team to admit.  Results for orders placed during the hospital encounter of 04/04/14  CBC WITH DIFFERENTIAL      Result Value Ref Range   WBC 7.0  4.0 - 10.5 K/uL   RBC 4.37  3.87 - 5.11 MIL/uL   Hemoglobin 12.4  12.0 - 15.0 g/dL   HCT 41.9  36.0 - 46.0 %   MCV 95.9  78.0 - 100.0 fL   MCH 28.4  26.0 - 34.0 pg   MCHC 29.6 (*) 30.0 - 36.0 g/dL   RDW 15.0  11.5 - 15.5 %   Platelets 298  150 - 400 K/uL   Neutrophils Relative % 78 (*) 43 - 77 %   Neutro Abs 5.4  1.7 - 7.7 K/uL   Lymphocytes Relative 12  12 - 46 %   Lymphs Abs 0.8  0.7 - 4.0 K/uL   Monocytes Relative 10  3 - 12 %   Monocytes Absolute 0.7  0.1 - 1.0 K/uL   Eosinophils Relative 0  0 - 5 %   Eosinophils Absolute 0.0  0.0 - 0.7 K/uL   Basophils Relative 0  0 - 1 %   Basophils Absolute 0.0  0.0 - 0.1 K/uL  COMPREHENSIVE METABOLIC PANEL      Result Value Ref Range   Sodium 133 (*) 137 - 147 mEq/L   Potassium 5.4 (*) 3.7 - 5.3 mEq/L   Chloride 100  96 - 112 mEq/L   CO2 21  19 - 32 mEq/L   Glucose, Bld 91  70 - 99 mg/dL   BUN 39 (*)  6 - 23 mg/dL   Creatinine, Ser 2.28 (*) 0.50 - 1.10 mg/dL   Calcium 9.2  8.4 - 10.5 mg/dL   Total Protein 7.6  6.0 - 8.3 g/dL   Albumin 3.7  3.5 - 5.2 g/dL   AST 16  0 - 37 U/L   ALT 5  0 - 35 U/L   Alkaline Phosphatase 104  39 - 117 U/L   Total Bilirubin 0.3  0.3 - 1.2 mg/dL   GFR calc non Af Amer 21 (*) >90 mL/min   GFR calc Af Amer 24 (*) >90 mL/min  LACTIC ACID, PLASMA      Result Value Ref Range   Lactic Acid, Venous 2.6 (*) 0.5 - 2.2 mmol/L  TROPONIN I      Result Value Ref Range   Troponin I <0.30  <0.30 ng/mL  PRO B NATRIURETIC PEPTIDE      Result Value Ref Range  Pro B Natriuretic peptide (BNP) 6040.0 (*) 0 - 125 pg/mL  CBC      Result Value Ref Range   WBC 9.5  4.0 - 10.5 K/uL   RBC 4.25  3.87 - 5.11 MIL/uL   Hemoglobin 12.3  12.0 - 15.0 g/dL   HCT 40.5  36.0 - 46.0 %   MCV 95.3  78.0 - 100.0 fL   MCH 28.9  26.0 - 34.0 pg   MCHC 30.4  30.0 - 36.0 g/dL   RDW 15.3  11.5 - 15.5 %   Platelets 319  150 - 400 K/uL  LACTIC ACID, PLASMA      Result Value Ref Range   Lactic Acid, Venous 3.0 (*) 0.5 - 2.2 mmol/L  TROPONIN I      Result Value Ref Range   Troponin I 0.45 (*) <0.30 ng/mL  BASIC METABOLIC PANEL      Result Value Ref Range   Sodium 137  137 - 147 mEq/L   Potassium 5.0  3.7 - 5.3 mEq/L   Chloride 102  96 - 112 mEq/L   CO2 17 (*) 19 - 32 mEq/L   Glucose, Bld 85  70 - 99 mg/dL   BUN 38 (*) 6 - 23 mg/dL   Creatinine, Ser 2.17 (*) 0.50 - 1.10 mg/dL   Calcium 8.6  8.4 - 10.5 mg/dL   GFR calc non Af Amer 22 (*) >90 mL/min   GFR calc Af Amer 26 (*) >90 mL/min  MAGNESIUM      Result Value Ref Range   Magnesium 1.8  1.5 - 2.5 mg/dL  PHOSPHORUS      Result Value Ref Range   Phosphorus 4.2  2.3 - 4.6 mg/dL  CARBOXYHEMOGLOBIN      Result Value Ref Range   Total hemoglobin 11.8 (*) 12.0 - 16.0 g/dL   O2 Saturation 67.3     Carboxyhemoglobin 1.1  0.5 - 1.5 %   Methemoglobin 1.1  0.0 - 1.5 %  BLOOD GAS, ARTERIAL      Result Value Ref Range   FIO2 0.40      Delivery systems BILEVEL POSITIVE AIRWAY PRESSURE     Inspiratory PAP 10     Expiratory PAP 5     pH, Arterial 7.205 (*) 7.350 - 7.450   pCO2 arterial 48.4 (*) 35.0 - 45.0 mmHg   pO2, Arterial 93.4  80.0 - 100.0 mmHg   Bicarbonate 18.4 (*) 20.0 - 24.0 mEq/L   TCO2 19.9  0 - 100 mmol/L   Acid-base deficit 8.1 (*) 0.0 - 2.0 mmol/L   O2 Saturation 98.4     Patient temperature 98.6     Collection site A-LINE     Drawn by [id number]     Sample type ARTERIAL DRAW    I-STAT ARTERIAL BLOOD GAS, ED      Result Value Ref Range   pH, Arterial 7.247 (*) 7.350 - 7.450   pCO2 arterial 46.5 (*) 35.0 - 45.0 mmHg   pO2, Arterial 59.0 (*) 80.0 - 100.0 mmHg   Bicarbonate 20.2  20.0 - 24.0 mEq/L   TCO2 22  0 - 100 mmol/L   O2 Saturation 85.0     Acid-base deficit 7.0 (*) 0.0 - 2.0 mmol/L   Patient temperature 98.6 F     Collection site RADIAL, ALLEN'S TEST ACCEPTABLE     Drawn by Operator     Sample type ARTERIAL    I-STAT CG4 LACTIC ACID, ED  Result Value Ref Range   Lactic Acid, Venous 2.36 (*) 0.5 - 2.2 mmol/L  TYPE AND SCREEN      Result Value Ref Range   ABO/RH(D) A POS     Antibody Screen NEG     Sample Expiration 04/07/2014     Dg Chest Port 1 View  04/04/2014   CLINICAL DATA:  Central line placement  EXAM: PORTABLE CHEST - 1 VIEW  COMPARISON:  04/04/2014 at 3:51 p.m.  FINDINGS: New right IJ central line. Cardiomediastinal contours are significantly distorted by rightward rotation, but the tip projects at the expected level of the right atrium.  No change in upper mediastinal contours, evidence of hemothorax, or pneumothorax. Airspace disease asymmetric to the right base, unchanged from prior.  IMPRESSION: 1. New right IJ central line, tip at the right atrium. No pneumothorax. 2. Unchanged right basilar airspace disease.   Electronically Signed   By: Jorje Guild M.D.   On: 04/04/2014 22:03   Dg Chest Portable 1 View  04/04/2014   CLINICAL DATA:  SOB  EXAM: PORTABLE CHEST - 1 VIEW   COMPARISON:  None.  FINDINGS: Normal cardiac silhouette. Patient rotated rightward. There is airspace disease in the right lower lobe and right upper lobe. Left lung is relatively clear.  IMPRESSION: Right upper and lower lobe airspace disease with differential including multifocal pneumonia versus asymmetric pulmonary edema.   Electronically Signed   By: Suzy Bouchard M.D.   On: 04/04/2014 16:08   CRITICAL CARE  RE hypotension, pna, met acidosis, resp failure,  Performed by: Mirna Mires Total critical care time: 50 Critical care time was exclusive of separately billable procedures and treating other patients. Critical care was necessary to treat or prevent imminent or life-threatening deterioration. Critical care was time spent personally by me on the following activities: development of treatment plan with patient and/or surrogate as well as nursing, discussions with consultants, evaluation of patient's response to treatment, examination of patient, obtaining history from patient or surrogate, ordering and performing treatments and interventions, ordering and review of laboratory studies, ordering and review of radiographic studies, pulse oximetry and re-evaluation of patient's condition.    Mirna Mires, MD 04/05/14 [phone]

## 2014-04-05 NOTE — Progress Notes (Signed)
Echocardiogram 2D Echocardiogram has been performed.  Ines Bloomer 04/05/2014, 11:43 AM

## 2014-04-05 NOTE — Procedures (Signed)
Intubation Procedure Note Shirley Schneider [id number] 1946-03-24  Procedure: Intubation Indications: Respiratory insufficiency  Procedure Details Consent: Risks of procedure as well as the alternatives and risks of each were explained to the (patient/caregiver).  Consent for procedure obtained. Time Out: Verified patient identification, verified procedure, site/side was marked, verified correct patient position, special equipment/implants available, medications/allergies/relevent history reviewed, required imaging and test results available.  Performed  Medications used: etomidate and succinylcholine Equipment used: Glidescope Grade I View Correct placement confirmed by by auscultation, by CXR and ETCO2 monitor Tube secured at 23 cm at the lip  Evaluation Hemodynamic Status: BP stable throughout; O2 sats: stable throughout Patient's Current Condition: stable Complications: No apparent complications Patient did tolerate procedure well. Chest X-ray ordered to verify placement.  CXR: pending.   Waynetta Pean, M.D. Pulmonary and Critical Care Medicine Call [place] with questions [phone] 04/05/2014

## 2014-04-05 NOTE — Progress Notes (Signed)
eLink Physician-Brief Progress Note Patient Name: Shirley Schneider DOB: 09/18/1946 MRN: [id number]  Date of Service  04/05/2014   HPI/Events of Note  Hyperkalemia with no EKG changes.  K of 6.1 in the setting of renal insufficiency.  Also with metabolic acidosis and pH of 7.2   eICU Interventions  Plan Kayexalate 30 gm via tube times one dose Increase TV on vent to 500 cc ABG in 1 hour post vent change   Intervention Category Major Interventions: Acid-Base disturbance - evaluation and management;Electrolyte abnormality - evaluation and management  Elizabeth C Deterding 04/05/2014, 3:50 AM

## 2014-04-06 ENCOUNTER — Inpatient Hospital Stay (HOSPITAL_COMMUNITY): Payer: Medicare Other

## 2014-04-06 DIAGNOSIS — I509 Heart failure, unspecified: Secondary | ICD-10-CM

## 2014-04-06 DIAGNOSIS — R0902 Hypoxemia: Secondary | ICD-10-CM

## 2014-04-06 DIAGNOSIS — N19 Unspecified kidney failure: Secondary | ICD-10-CM

## 2014-04-06 LAB — BODY FLUID CELL COUNT WITH DIFFERENTIAL
Eos, Fluid: 1 %
LYMPHS FL: 1 %
MONOCYTE-MACROPHAGE-SEROUS FLUID: 2 % — AB (ref 50–90)
NEUTROPHIL FLUID: 96 % — AB (ref 0–25)
WBC FLUID: 10650 uL — AB (ref 0–1000)

## 2014-04-06 LAB — BASIC METABOLIC PANEL
BUN: 29 mg/dL — ABNORMAL HIGH (ref 6–23)
CALCIUM: 8.7 mg/dL (ref 8.4–10.5)
CO2: 19 meq/L (ref 19–32)
CREATININE: 1.11 mg/dL — AB (ref 0.50–1.10)
Chloride: 105 mEq/L (ref 96–112)
GFR calc Af Amer: 58 mL/min — ABNORMAL LOW (ref >90)
GFR, EST NON AFRICAN AMERICAN: 50 mL/min — AB (ref >90)
Glucose, Bld: 92 mg/dL (ref 70–99)
Potassium: 3.5 mEq/L — ABNORMAL LOW (ref 3.7–5.3)
Sodium: 140 mEq/L (ref 137–147)

## 2014-04-06 LAB — URINE CULTURE
Colony Count: NO GROWTH
Culture: NO GROWTH

## 2014-04-06 LAB — RESPIRATORY VIRUS PANEL
ADENOVIRUS: NOT DETECTED
INFLUENZA A H1: NOT DETECTED
INFLUENZA A H3: NOT DETECTED
INFLUENZA B 1: NOT DETECTED
Influenza A: NOT DETECTED
Metapneumovirus: NOT DETECTED
PARAINFLUENZA 3 A: NOT DETECTED
Parainfluenza 1: NOT DETECTED
Parainfluenza 2: NOT DETECTED
RESPIRATORY SYNCYTIAL VIRUS A: NOT DETECTED
RESPIRATORY SYNCYTIAL VIRUS B: NOT DETECTED
Rhinovirus: NOT DETECTED

## 2014-04-06 LAB — CBC WITH DIFFERENTIAL/PLATELET
BASOS ABS: 0 10*3/uL (ref 0.0–0.1)
Basophils Relative: 0 % (ref 0–1)
EOS ABS: 0.1 10*3/uL (ref 0.0–0.7)
EOS PCT: 0 % (ref 0–5)
HEMATOCRIT: 32.2 % — AB (ref 36.0–46.0)
Hemoglobin: 10.3 g/dL — ABNORMAL LOW (ref 12.0–15.0)
LYMPHS PCT: 9 % — AB (ref 12–46)
Lymphs Abs: 1.2 10*3/uL (ref 0.7–4.0)
MCH: 28.8 pg (ref 26.0–34.0)
MCHC: 32 g/dL (ref 30.0–36.0)
MCV: 89.9 fL (ref 78.0–100.0)
MONO ABS: 0.4 10*3/uL (ref 0.1–1.0)
Monocytes Relative: 3 % (ref 3–12)
Neutro Abs: 11.7 10*3/uL — ABNORMAL HIGH (ref 1.7–7.7)
Neutrophils Relative %: 88 % — ABNORMAL HIGH (ref 43–77)
Platelets: 256 10*3/uL (ref 150–400)
RBC: 3.58 MIL/uL — ABNORMAL LOW (ref 3.87–5.11)
RDW: 15 % (ref 11.5–15.5)
WBC: 13.3 10*3/uL — AB (ref 4.0–10.5)

## 2014-04-06 LAB — PROTIME-INR
INR: 1.34 (ref 0.00–1.49)
Prothrombin Time: 16.3 seconds — ABNORMAL HIGH (ref 11.6–15.2)

## 2014-04-06 LAB — LEGIONELLA ANTIGEN, URINE: LEGIONELLA ANTIGEN, URINE: NEGATIVE

## 2014-04-06 LAB — GLUCOSE, CAPILLARY
GLUCOSE-CAPILLARY: 98 mg/dL (ref 70–99)
GLUCOSE-CAPILLARY: 93 mg/dL (ref 70–99)

## 2014-04-06 LAB — LACTIC ACID, PLASMA: LACTIC ACID, VENOUS: 1.2 mmol/L (ref 0.5–2.2)

## 2014-04-06 MED ORDER — ETOMIDATE 2 MG/ML IV SOLN
INTRAVENOUS | Status: AC
  Filled 2014-04-06: qty 10

## 2014-04-06 MED ORDER — PRO-STAT SUGAR FREE PO LIQD
30.0000 mL | Freq: Two times a day (BID) | ORAL | Status: DC
  Administered 2014-04-06 – 2014-04-09 (×8): 30 mL
  Filled 2014-04-06 (×12): qty 30

## 2014-04-06 MED ORDER — SODIUM CHLORIDE 0.9 % IV SOLN
0.0000 ug/h | INTRAVENOUS | Status: DC
  Administered 2014-04-06: 100 ug/h via INTRAVENOUS
  Administered 2014-04-10: 250 ug/h via INTRAVENOUS
  Filled 2014-04-06 (×4): qty 50

## 2014-04-06 MED ORDER — DEXTROSE 5 % IV SOLN
500.0000 mg | INTRAVENOUS | Status: DC
  Administered 2014-04-06 – 2014-04-08 (×3): 500 mg via INTRAVENOUS
  Filled 2014-04-06 (×4): qty 500

## 2014-04-06 MED ORDER — FENTANYL BOLUS VIA INFUSION
25.0000 ug | INTRAVENOUS | Status: DC | PRN
  Filled 2014-04-06: qty 50

## 2014-04-06 MED ORDER — MIDAZOLAM HCL 2 MG/2ML IJ SOLN
1.0000 mg | INTRAMUSCULAR | Status: DC | PRN
  Administered 2014-04-10 (×2): 1 mg via INTRAVENOUS
  Filled 2014-04-06 (×2): qty 2

## 2014-04-06 MED ORDER — FENTANYL CITRATE 0.05 MG/ML IJ SOLN: 50.0000 ug | Freq: Once | INTRAMUSCULAR | Status: AC

## 2014-04-06 MED ORDER — VITAL HIGH PROTEIN PO LIQD
1000.0000 mL | ORAL | Status: DC
  Administered 2014-04-06: 20:00:00
  Administered 2014-04-06: 1000 mL
  Administered 2014-04-07
  Administered 2014-04-07: 1000 mL
  Administered 2014-04-08 (×2)
  Administered 2014-04-08: 1000 mL
  Administered 2014-04-08 (×2)
  Administered 2014-04-09: 1000 mL
  Filled 2014-04-06 (×6): qty 1000

## 2014-04-06 MED ORDER — POTASSIUM CHLORIDE 10 MEQ/50ML IV SOLN
10.0000 meq | INTRAVENOUS | Status: AC
  Administered 2014-04-06 (×2): 10 meq via INTRAVENOUS
  Filled 2014-04-06 (×2): qty 50

## 2014-04-06 MED ORDER — VITAL HIGH PROTEIN PO LIQD
1000.0000 mL | ORAL | Status: DC
  Filled 2014-04-06 (×2): qty 1000

## 2014-04-06 MED ORDER — ETOMIDATE 2 MG/ML IV SOLN
20.0000 mg | Freq: Once | INTRAVENOUS | Status: AC
  Administered 2014-04-06: 20 mg via INTRAVENOUS

## 2014-04-06 NOTE — Progress Notes (Signed)
[facility] ADULT ICU REPLACEMENT PROTOCOL FOR AM LAB REPLACEMENT ONLY  The patient does apply for the [facility] Adult ICU Electrolyte Replacment Protocol based on the criteria listed below:   1. Is GFR >/= 40 ml/min? yes  Patient's GFR today is 58 2. Is urine output >/= 0.5 ml/kg/hr for the last 6 hours? yes Patient's UOP is 0.8 ml/kg/hr 3. Is BUN < 60 mg/dL? yes  Patient's BUN today is 29 4. Abnormal electrolyte(s): K+3.5 5. Ordered repletion with: protocol 6. If a panic level lab has been reported, has the CCM MD in charge been notified? yes.   Physician:  Luther Redo [facility] 04/06/2014 6:21 AM

## 2014-04-06 NOTE — Procedures (Signed)
Bronchoscopy Procedure Note Shirley Schneider [id number] [birth year]  Procedure: Bronchoscopy Indications: Diagnostic evaluation of the airways and Obtain specimens for culture and/or other diagnostic studies  Procedure Details Consent: Risks of procedure as well as the alternatives and risks of each were explained to the (patient/caregiver).  Consent for procedure obtained. Time Out: Verified patient identification, verified procedure, site/side was marked, verified correct patient position, special equipment/implants available, medications/allergies/relevent history reviewed, required imaging and test results available.  Performed  In preparation for procedure, patient was given 100% FiO2. Sedation: Etomidate  Airway entered and the following bronchi were examined: RUL, RML, RLL, LUL, LLL and Bronchi.   Procedures performed: Brushings performed Bronchoscope removed.  , Patient placed back on 100% FiO2 at conclusion of procedure.    Evaluation Hemodynamic Status: BP stable throughout; O2 sats: stable throughout Patient's Current Condition: stable Specimens:  Sent purulent fluid Complications: No apparent complications Patient did tolerate procedure well.   Shirley Schneider 04/06/2014  1. Extensive murky pus RML, RLL, BAL done RML, pus 2. Left wnl 3. extesnive distal plugging rt 4. Only 2 seg rul  Shirley Schneider. Titus Mould, MD, [place] Pgr: [facility]

## 2014-04-06 NOTE — Progress Notes (Addendum)
INITIAL NUTRITION ASSESSMENT  DOCUMENTATION CODES Per approved criteria  -Morbid Obesity   INTERVENTION: If unable to extubate, recommend initiation of Vital High Protein via enteral feeding tube at 20 ml/hr, advance by 10 ml q 4 hours, to goal of 50 ml/hr. Add 30 ml Prostat liquid protein via tube BID. Goal regimen will provide: 1400 kcal, 136 g protein, 1003 ml free water. RD to continue to follow nutrition care plan.  NUTRITION DIAGNOSIS: Inadequate oral intake related to inability to eat as evidenced by NPO status.   Goal: Enteral nutrition to provide 60-70% of estimated calorie needs (22-25 kcals/kg ideal body weight) and 100% of estimated protein needs, based on ASPEN guidelines for permissive underfeeding in critically ill obese individuals.  Monitor:  weight trends, lab trends, I/O's, initiation of nutrition support,   Reason for Assessment: Ventilator Use  68 y.o. female  Admitting Dx: acute respiratory failure  ASSESSMENT: PMHx significant for asthma, HTN, fibromyalgia. Admitted with SOB and cough with production of pink frothy sputum. Work-up reveals acute respiratory failure.  Intubated 5/31. Per most recent MD note, plan to consider TF if not extubated on 6/1.  Patient is currently intubated on ventilator support MV: 11 L/min Temp (24hrs), Avg:100.3 F (37.9 C), Min:98.3 F (36.8 C), Max:101.5 F (38.6 C)  Propofol: none  Brief physical exam reveals pt without significant fat or muscle mass loss.  Potassium low at 3.5 mEq/L--> ordered for potassium chloride Phosphorus elevated at 4.9 mg/dL  Height: Ht Readings from Last 1 Encounters:  04/05/14 5\' 3"  (1.6 m)    Weight: Wt Readings from Last 1 Encounters:  04/06/14 236 lb 8.9 oz (107.3 kg)    Ideal Body Weight: 115 lb  % Ideal Body Weight: 205%  Wt Readings from Last 10 Encounters:  04/06/14 236 lb 8.9 oz (107.3 kg)  08/12/08 197 lb 2.1 oz (89.418 kg)  05/19/08 181 lb 6.1 oz (82.274 kg)   05/01/08 180 lb 12.8 oz (82.01 kg)  04/29/08 175 lb 12.8 oz (79.742 kg)    Usual Body Weight: n/a  % Usual Body Weight: n/a  BMI:  Body mass index is 41.91 kg/(m^2). Obese Class III  Estimated Nutritional Needs: Kcal: 2020 kcal Permissive underfeeding goal: 1200 - 1400 kcal Protein: at least 130 g daily Fluid: approx 2 liters  Skin:  Stage II to R and L buttocks  Diet Order: NPO  EDUCATION NEEDS: -No education needs identified at this time   Intake/Output Summary (Last 24 hours) at 04/06/14 0828 Last data filed at 04/06/14 0600  Gross per 24 hour  Intake 1013.94 ml  Output   2190 ml  Net -1176.06 ml    Last BM: 5/30  Labs:   Recent Labs Lab 04/04/14 2022 04/05/14 0200 04/05/14 1416 04/06/14 0500  NA 137 135* 139 140  K 5.0 6.1* 3.9 3.5*  CL 102 102 104 105  CO2 17* 18* 17* 19  BUN 38* 42* 40* 29*  CREATININE 2.17* 2.33* 1.71* 1.11*  CALCIUM 8.6 8.2* 8.7 8.7  MG 1.8 1.8  --   --   PHOS 4.2 4.9*  --   --   GLUCOSE 85 86 77 92    CBG (last 3)  No results found for this basename: GLUCAP,  in the last 72 hours  Scheduled Meds: . antiseptic oral rinse  15 mL Mouth Rinse QID  . chlorhexidine  15 mL Mouth Rinse BID  . heparin  5,000 Units Subcutaneous 3 times per day  . pantoprazole (PROTONIX) IV  40 mg Intravenous Daily  . piperacillin-tazobactam (ZOSYN)  IV  3.375 g Intravenous 3 times per day  . potassium chloride  10 mEq Intravenous Q1 Hr x 2  . vancomycin  1,500 mg Intravenous Q24H    Continuous Infusions: . midazolam (VERSED) infusion 4 mg/hr (04/06/14 0357)  . norepinephrine (LEVOPHED) Adult infusion Stopped (04/06/14 0400)    Past Medical History  Diagnosis Date  . Fibromyalgia   . Asthma   . Hypertension     Past Surgical History  Procedure Laterality Date  . Hernia repair  1970    Inda Coke MS, RD, LDN Inpatient Registered Dietitian Pager: [phone] After-hours pager: [phone]  Received consult for enteral management,  ordered TF per RD recommendations. RD to continue to monitor.   Mikey College MS, [place], [place] Pager [phone] Weekend/After Hours Pager

## 2014-04-06 NOTE — Progress Notes (Signed)
PULMONARY / CRITICAL CARE MEDICINE   Name: Shirley Schneider MRN: [id number] DOB: 03-Sep-1946    ADMISSION DATE:  04/04/2014  PRIMARY SERVICE: PCCM  CHIEF COMPLAINT:  Right sided chest pain, SOB, pink frothy sputum.  BRIEF PATIENT DESCRIPTION:   68 years old female with PMH relevant for asthma, HTN and fibromyalgia. 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 Being managed for PNA, VDRF.  SIGNIFICANT EVENTS / STUDIES:  04/04/2014 -04/04/2014 - Admit 6/1 off levophed  LINES / TUBES: 5/30 rt ij>>> 5/30 ETT>>>  CULTURES: - Blood cultures ordered - Urine culture ordered - Sputum culture ordered - resp virus 04/05/14 - urine leg 04/05/14 - urine strep 04/05/14  ANTIBIOTICS: 5/30 Rocephin>>> 5/20 Azithromycin>>> 5/31 zosyn>>> 5/31 vanc>>>  SUBJECTIVE:  Off pressors  VITAL SIGNS: Temp:  [98.3 F (36.8 C)-101.5 F (38.6 C)] 101.3 F (38.5 C) (06/01 1132) Pulse Rate:  [97-111] 103 (06/01 1132) Resp:  [20-39] 26 (06/01 1200) BP: (113-161)/(68-92) 144/84 mmHg (06/01 1132) SpO2:  [98 %-100 %] 100 % (06/01 1200) Arterial Line BP: (91-172)/(67-94) 131/84 mmHg (06/01 1200) FiO2 (%):  [40 %] 40 % (06/01 1132) Weight:  [107.3 kg (236 lb 8.9 oz)] 107.3 kg (236 lb 8.9 oz) (06/01 0350) HEMODYNAMICS: CVP:  [9 mmHg] 9 mmHg VENTILATOR SETTINGS: Vent Mode:  [-] PRVC FiO2 (%):  [40 %] 40 % Set Rate:  [26 bmp] 26 bmp Vt Set:  [500 mL] 500 mL PEEP:  [5 cmH20] 5 cmH20 Pressure Support:  [8 cmH20] 8 cmH20 Plateau Pressure:  [19 cmH20-27 cmH20] 26 cmH20 INTAKE / OUTPUT: Intake/Output     05/31 0701 - 06/01 0700 06/01 0701 - 06/02 0700   I.V. (mL/kg) 436.8 (4.1) 66.9 (0.6)   IV Piggyback 625 125   Total Intake(mL/kg) 1061.8 (9.9) 191.9 (1.8)   Urine (mL/kg/hr) 2315 (0.9) 455 (0.8)   Total Output 2315 455   Net -1253.2 -263.1        Stool Occurrence  1 x     PHYSICAL EXAMINATION: General:  vented Neuro: rass -1, follows commands HEENT: ETT PULM: ronchi diffuse CV: s1s2 RRR  GI: soft, BS wnml no r Extremities: edema gen   LABS:   PULMONARY  Recent Labs Lab 04/04/14 1624 04/04/14 2240 04/05/14 0100 04/05/14 0253 04/05/14 0525  PHART 7.247* 7.205* 7.163* 7.257* 7.348*  PCO2ART 46.5* 48.4* 54.8* 37.1 28.6*  PO2ART 59.0* 93.4 86.0 76.0* 90.0  HCO3 20.2 18.4* 19.7* 16.6* 15.7*  TCO2 22 19.9 21 18 17   O2SAT 85.0 67.3  98.4 93.0 93.0 97.0    CBC  Recent Labs Lab 04/04/14 2022 04/05/14 0200 04/06/14 0500  HGB 12.3 11.5* 10.3*  HCT 40.5 37.6 32.2*  WBC 9.5 14.9* 13.3*  PLT 319 337 256    COAGULATION  Recent Labs Lab 04/06/14 0500  INR 1.34    CARDIAC    Recent Labs Lab 04/04/14 1605 04/04/14 2022 04/05/14 0200 04/05/14 0750  TROPONINI <0.30 0.45* 0.69* 0.77*    Recent Labs Lab 04/04/14 1605  PROBNP 6040.0*     CHEMISTRY  Recent Labs Lab 04/04/14 1605 04/04/14 2022 04/05/14 0200 04/05/14 1416 04/06/14 0500  NA 133* 137 135* 139 140  K 5.4* 5.0 6.1* 3.9 3.5*  CL 100 102 102 104 105  CO2 21 17* 18* 17* 19  GLUCOSE 91 85 86 77 92  BUN 39* 38* 42* 40* 29*  CREATININE 2.28* 2.17* 2.33* 1.71* 1.11*  CALCIUM 9.2 8.6 8.2* 8.7 8.7  MG  --  1.8 1.8  --   --   PHOS  --  4.2 4.9*  --   --    Estimated Creatinine Clearance: 57.8 ml/min (by C-G formula based on Cr of 1.11).   LIVER  Recent Labs Lab 04/04/14 1605 04/06/14 0500  AST 16  --   ALT 5  --   ALKPHOS 104  --   BILITOT 0.3  --   PROT 7.6  --   ALBUMIN 3.7  --   INR  --  1.34     INFECTIOUS  Recent Labs Lab 04/04/14 2022 04/05/14 1416 04/06/14 0500  LATICACIDVEN 3.0* 1.4 1.2     ENDOCRINE CBG (last 3)  No results found for this basename: GLUCAP,  in the last 72 hours       IMAGING x48h  Ct Chest Wo Contrast  04/06/2014   CLINICAL DATA:  Evaluate infiltrates. History of previous the eosinophilic pneumonia  EXAM: CT CHEST WITHOUT CONTRAST   TECHNIQUE: Multidetector CT imaging of the chest was performed following the standard protocol without IV contrast.  COMPARISON:  05/01/2008 chest CT  FINDINGS: THORACIC INLET/BODY WALL:  Endotracheal tube ends in the mid thoracic trachea. Right IJ central line, tip at the upper cavoatrial junction. There is an orogastric tube which enters the stomach at least. No acute findings.  MEDIASTINUM:  Normal heart size. No pericardial effusion. No acute vascular findings. Mild enlargement of the right lower peritracheal and subcarinal nodes. Small sliding-type hiatal hernia.  LUNG WINDOWS:  Dense consolidation in the right lower lobe, essentially involving the entire lobe. Debris noted within the associated airways. There are few punctate high-density foci within the right lower (also left lower) lobe, which could represent high-density debris, with high-density material also seen in the stomach. Less extensive opacity in the left lower lobe. On the left there is bronchial crowding compatible with volume loss and atelectasis. Reticular nodular opacities in the right suprahilar lung were also seen in 2009, consistent with scarring. The current findings are much more confluent and extensive that the pulmonary findings seen in 2009, when there was reported eosinophilic pneumonia. No effusion or cavitation.  UPPER ABDOMEN:  No acute findings.  OSSEOUS:  No acute fracture.  No suspicious lytic or blastic lesions.  IMPRESSION: 1. Bilateral lower lobe pneumonia, extensive on the right. 2. Consider aspiration as the inciting cause given the lower lobe pattern, hiatal hernia, and high-density debris in the lower lobes.   Electronically Signed   By: Jorje Guild M.D.   On: 04/06/2014 01:38   Dg Chest Port 1 View  04/06/2014   CLINICAL DATA:  Check endotracheal tube, shortness of breath.  EXAM: PORTABLE CHEST - 1 VIEW  COMPARISON:  CT chest and chest radiograph 04/05/2014.  FINDINGS: The endotracheal tube terminates  approximately 5 mm above the carina. Nasogastric tube is followed into the stomach with the tip projecting beyond the inferior margin of the image. Right IJ central line tip projects over the SVC RA junction. Heart size stable. Right perihilar and bibasilar airspace consolidation persist. No definite pleural effusions.  IMPRESSION: 1. Endotracheal tube appears low lying. Retracting approximately 1-2 cm should better position the tip above the carina. 2. Right perihilar and bibasilar airspace consolidation, worrisome for pneumonia, stable.   Electronically Signed   By: Lorin Picket M.D.   On: 04/06/2014 07:04   Dg Chest Port 1 View  04/05/2014   CLINICAL DATA:  Check endotracheal  tube position  EXAM: PORTABLE CHEST - 1 VIEW  COMPARISON:  04/04/2014  FINDINGS: An endotracheal tube is been placed in the interval. It lies 18 mm above the carina. The nasogastric catheter is within normal limits. The right jugular central line is again seen and stable. Increasing infiltrative density is noted in the right lung base. The left lung remains predominantly clear. The overall inspiratory effort is poor.  IMPRESSION: Increasing right basilar infiltrate.  Tubes and lines as described.   Electronically Signed   By: Inez Catalina M.D.   On: 04/05/2014 07:38   Dg Chest Port 1 View  04/04/2014   CLINICAL DATA:  Central line placement  EXAM: PORTABLE CHEST - 1 VIEW  COMPARISON:  04/04/2014 at 3:51 p.m.  FINDINGS: New right IJ central line. Cardiomediastinal contours are significantly distorted by rightward rotation, but the tip projects at the expected level of the right atrium.  No change in upper mediastinal contours, evidence of hemothorax, or pneumothorax. Airspace disease asymmetric to the right base, unchanged from prior.  IMPRESSION: 1. New right IJ central line, tip at the right atrium. No pneumothorax. 2. Unchanged right basilar airspace disease.   Electronically Signed   By: Jorje Guild M.D.   On: 04/04/2014  22:03   Dg Chest Portable 1 View  04/04/2014   CLINICAL DATA:  SOB  EXAM: PORTABLE CHEST - 1 VIEW  COMPARISON:  None.  FINDINGS: Normal cardiac silhouette. Patient rotated rightward. There is airspace disease in the right lower lobe and right upper lobe. Left lung is relatively clear.  IMPRESSION: Right upper and lower lobe airspace disease with differential including multifocal pneumonia versus asymmetric pulmonary edema.   Electronically Signed   By: Suzy Bouchard M.D.   On: 04/04/2014 16:08     ASSESSMENT / PLAN:  PULMONARY A: 1) Acute hypoxic and hypercarbic respiratory failure - intubated 2) DDx - CAP, Acute CHF NOS, Eosinophilic pna (peripheral eos normal)  P:   -will consider bronch with BAL, assess cell diff, would change our management drastically,also send BAl culture with increase fevers, culture neg -wean cpap 5 ps 5, goal 2 hrs -pcxr in am  -even to neg balance  CARDIOVASCULAR A:  1) Circulatory shock In the differential CHF vs septic shock; COox 67 makes cardiogenic less likely 2) Likely type 2 stress NSTEMI  P:  -tele, off pressors -map goal 60  RENAL A:   1) Acute renal failure resolving 2 mild hypokalemia P:   - k supp -bmet in am  -allow her to auto diuresis  GASTROINTESTINAL A:   1) NPO P:   - IV Pepcid - start TF  After bronch  HEMATOLOGIC A:   1) DVt prev, mild anemia P:  - DVT prophylaxis with SQ heparin -cbc in am   INFECTIOUS A:   1) r/o CAP vs reoccurence eos PNA P:   - re start Azithromycin - atypical coverage -zosyn, vanc -fevers, cosnider bronch bal, see pulm  ENDOCRINE A:   1) No history of diabetes 2) No recent use of prednisone P:    NEUROLOGIC A:   1) Somnolent but easily arousable. Non focal P:   Dc versed gtt to prn versed Use fent prn  GLOBAL: alter sedation, bronch, failed weaning   Ccm time 30 min   [name]. Titus Mould, MD, [place] Pgr: [facility]

## 2014-04-07 ENCOUNTER — Inpatient Hospital Stay (HOSPITAL_COMMUNITY): Payer: Medicare Other

## 2014-04-07 DIAGNOSIS — J8409 Other alveolar and parieto-alveolar conditions: Secondary | ICD-10-CM

## 2014-04-07 LAB — BLOOD GAS, ARTERIAL
ACID-BASE DEFICIT: 3.4 mmol/L — AB (ref 0.0–2.0)
BICARBONATE: 19.6 meq/L — AB (ref 20.0–24.0)
Drawn by: 312761
FIO2: 0.4 %
MECHVT: 500 mL
O2 Saturation: 97.6 %
PCO2 ART: 26.8 mmHg — AB (ref 35.0–45.0)
PEEP: 5 cmH2O
PO2 ART: 94.9 mmHg (ref 80.0–100.0)
Patient temperature: 98.6
RATE: 26 resp/min
TCO2: 20.4 mmol/L (ref 0–100)
pH, Arterial: 7.476 — ABNORMAL HIGH (ref 7.350–7.450)

## 2014-04-07 LAB — COMPREHENSIVE METABOLIC PANEL
ALK PHOS: 108 U/L (ref 39–117)
ALT: 5 U/L (ref 0–35)
AST: 15 U/L (ref 0–37)
Albumin: 2.4 g/dL — ABNORMAL LOW (ref 3.5–5.2)
BILIRUBIN TOTAL: 0.6 mg/dL (ref 0.3–1.2)
BUN: 26 mg/dL — AB (ref 6–23)
CHLORIDE: 106 meq/L (ref 96–112)
CO2: 18 mEq/L — ABNORMAL LOW (ref 19–32)
Calcium: 8.1 mg/dL — ABNORMAL LOW (ref 8.4–10.5)
Creatinine, Ser: 0.97 mg/dL (ref 0.50–1.10)
GFR calc Af Amer: 69 mL/min — ABNORMAL LOW (ref >90)
GFR calc non Af Amer: 59 mL/min — ABNORMAL LOW (ref >90)
Glucose, Bld: 106 mg/dL — ABNORMAL HIGH (ref 70–99)
POTASSIUM: 3.1 meq/L — AB (ref 3.7–5.3)
Sodium: 138 mEq/L (ref 137–147)
Total Protein: 5.9 g/dL — ABNORMAL LOW (ref 6.0–8.3)

## 2014-04-07 LAB — PATHOLOGIST SMEAR REVIEW

## 2014-04-07 LAB — CBC WITH DIFFERENTIAL/PLATELET
Basophils Absolute: 0 10*3/uL (ref 0.0–0.1)
Basophils Relative: 0 % (ref 0–1)
EOS ABS: 0.2 10*3/uL (ref 0.0–0.7)
Eosinophils Relative: 1 % (ref 0–5)
HEMATOCRIT: 26.5 % — AB (ref 36.0–46.0)
HEMOGLOBIN: 8.7 g/dL — AB (ref 12.0–15.0)
Lymphocytes Relative: 13 % (ref 12–46)
Lymphs Abs: 1.5 10*3/uL (ref 0.7–4.0)
MCH: 29 pg (ref 26.0–34.0)
MCHC: 32.8 g/dL (ref 30.0–36.0)
MCV: 88.3 fL (ref 78.0–100.0)
MONO ABS: 0.4 10*3/uL (ref 0.1–1.0)
MONOS PCT: 3 % (ref 3–12)
NEUTROS ABS: 10.2 10*3/uL — AB (ref 1.7–7.7)
Neutrophils Relative %: 83 % — ABNORMAL HIGH (ref 43–77)
Platelets: 230 10*3/uL (ref 150–400)
RBC: 3 MIL/uL — AB (ref 3.87–5.11)
RDW: 15 % (ref 11.5–15.5)
WBC: 12.2 10*3/uL — ABNORMAL HIGH (ref 4.0–10.5)

## 2014-04-07 LAB — GLUCOSE, CAPILLARY
GLUCOSE-CAPILLARY: 99 mg/dL (ref 70–99)
GLUCOSE-CAPILLARY: 130 mg/dL — AB (ref 70–99)
GLUCOSE-CAPILLARY: 109 mg/dL — AB (ref 70–99)
Glucose-Capillary: 119 mg/dL — ABNORMAL HIGH (ref 70–99)
Glucose-Capillary: 115 mg/dL — ABNORMAL HIGH (ref 70–99)

## 2014-04-07 LAB — FUNGAL STAIN: Fungal Smear: NONE SEEN

## 2014-04-07 MED ORDER — FUROSEMIDE 10 MG/ML IJ SOLN
40.0000 mg | Freq: Two times a day (BID) | INTRAMUSCULAR | Status: DC
  Administered 2014-04-07 (×2): 40 mg via INTRAVENOUS
  Filled 2014-04-07 (×3): qty 4

## 2014-04-07 MED ORDER — FUROSEMIDE 10 MG/ML IJ SOLN
INTRAMUSCULAR | Status: AC
  Filled 2014-04-07: qty 4

## 2014-04-07 MED ORDER — VANCOMYCIN HCL IN DEXTROSE 1-5 GM/200ML-% IV SOLN
1000.0000 mg | Freq: Two times a day (BID) | INTRAVENOUS | Status: DC
  Filled 2014-04-07: qty 200

## 2014-04-07 MED ORDER — POTASSIUM CHLORIDE 20 MEQ/15ML (10%) PO LIQD
40.0000 meq | Freq: Once | ORAL | Status: AC
  Administered 2014-04-07: 40 meq
  Filled 2014-04-07: qty 30

## 2014-04-07 NOTE — Progress Notes (Signed)
RT note: Pulled ETT out 2 cm to 21 cm at the lip per MD.

## 2014-04-07 NOTE — Progress Notes (Signed)
eLink Physician-Brief Progress Note Patient Name: TONGA PROUT DOB: [birth year] MRN: [id number]  Date of Service  04/07/2014   HPI/Events of Note  Hypokalemia   eICU Interventions  Potassium replaced   Intervention Category Minor Interventions: Electrolytes abnormality - evaluation and management  Guadelupe Sabin Chasin [place] 04/07/2014, 5:31 AM

## 2014-04-07 NOTE — Progress Notes (Addendum)
PULMONARY / CRITICAL CARE MEDICINE   Name: TAKAYLA BAILLIE MRN: [id number] DOB: 01/11/1946    ADMISSION DATE:  04/04/2014  PRIMARY SERVICE: PCCM  CHIEF COMPLAINT:  Right sided chest pain, SOB, pink frothy sputum.  BRIEF PATIENT DESCRIPTION:   68 years old female with PMH relevant for asthma, HTN and fibromyalgia. 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 Being managed for PNA, VDRF.  SIGNIFICANT EVENTS / STUDIES:  04/04/2014 -04/04/2014 - Admit 6/1 off levophed 6/1 - bronch, extensive merky pus rt   LINES / TUBES: 5/30 rt ij>>> 5/30 ETT>>>  CULTURES: - Blood cultures 5/30>>> - Urine culture 5/20>>> - Sputum culture 5/30>>> - resp virus 04/05/14>>>neg - urine leg 04/05/14>>> - urine strep 04/05/14>>> 6/1 Bronch BAL>>>  Fungal>>>neg  Cell diff>>>  ANTIBIOTICS: 5/30 Rocephin>>>6/1 5/20 Azithromycin>>> 5/31 zosyn>>> 5/31 vanc>>>  SUBJECTIVE:  Off pressors  VITAL SIGNS: Temp:  [98.2 F (36.8 C)-101.3 F (38.5 C)] 99.1 F (37.3 C) (06/02 0757) Pulse Rate:  [76-103] 90 (06/02 0757) Resp:  [21-29] 27 (06/02 1000) BP: (70-144)/(53-84) 116/72 mmHg (06/02 0757) SpO2:  [100 %] 100 % (06/02 1000) Arterial Line BP: (73-134)/(53-85) 73/67 mmHg (06/02 1000) FiO2 (%):  [40 %] 40 % (06/02 0757) Weight:  [109.1 kg (240 lb 8.4 oz)] 109.1 kg (240 lb 8.4 oz) (06/02 0500) HEMODYNAMICS:   VENTILATOR SETTINGS: Vent Mode:  [-] PSV FiO2 (%):  [40 %] 40 % Set Rate:  [26 bmp] 26 bmp Vt Set:  [500 mL] 500 mL PEEP:  [5 cmH20] 5 cmH20 Pressure Support:  [10 cmH20] 10 cmH20 Plateau Pressure:  [20 RWE31-54 cmH20] 28 cmH20 INTAKE / OUTPUT: Intake/Output     06/01 0701 - 06/02 0700 06/02 0701 - 06/03 0700   I.V. (mL/kg) 354.2 (3.2) 69.4 (0.6)   NG/GT 551.7 160   IV Piggyback 975    Total Intake(mL/kg) 1880.9 (17.2) 229.4 (2.1)   Urine (mL/kg/hr) 1180 (0.5) 125 (0.3)   Total Output 1180 125    Net +700.9 +104.4        Stool Occurrence 2 x      PHYSICAL EXAMINATION: General: vented Neuro: rass0, follows commands HEENT: ETT PULM: ronchi rt greater left CV: s1s2 RRR  GI: soft, BS wnml no r Extremities: edema gen   LABS:   PULMONARY  Recent Labs Lab 04/04/14 2240 04/05/14 0100 04/05/14 0253 04/05/14 0525 04/07/14 0326  PHART 7.205* 7.163* 7.257* 7.348* 7.476*  PCO2ART 48.4* 54.8* 37.1 28.6* 26.8*  PO2ART 93.4 86.0 76.0* 90.0 94.9  HCO3 18.4* 19.7* 16.6* 15.7* 19.6*  TCO2 19.9 21 18 17  20.4  O2SAT 67.3  98.4 93.0 93.0 97.0 97.6    CBC  Recent Labs Lab 04/05/14 0200 04/06/14 0500 04/07/14 0430  HGB 11.5* 10.3* 8.7*  HCT 37.6 32.2* 26.5*  WBC 14.9* 13.3* 12.2*  PLT 337 256 230    COAGULATION  Recent Labs Lab 04/06/14 0500  INR 1.34    CARDIAC    Recent Labs Lab 04/04/14 1605 04/04/14 2022 04/05/14 0200 04/05/14 0750  TROPONINI <0.30 0.45* 0.69* 0.77*    Recent Labs Lab 04/04/14 1605  PROBNP 6040.0*     CHEMISTRY  Recent Labs Lab 04/04/14 2022 04/05/14 0200 04/05/14 1416 04/06/14 0500 04/07/14 0430  NA 137 135* 139 140 138  K 5.0 6.1* 3.9 3.5* 3.1*  CL 102 102 104 105 106  CO2 17* 18* 17* 19 18*  GLUCOSE 85 86  77 92 106*  BUN 38* 42* 40* 29* 26*  CREATININE 2.17* 2.33* 1.71* 1.11* 0.97  CALCIUM 8.6 8.2* 8.7 8.7 8.1*  MG 1.8 1.8  --   --   --   PHOS 4.2 4.9*  --   --   --    Estimated Creatinine Clearance: 66.7 ml/min (by C-G formula based on Cr of 0.97).   LIVER  Recent Labs Lab 04/04/14 1605 04/06/14 0500 04/07/14 0430  AST 16  --  15  ALT 5  --  5  ALKPHOS 104  --  108  BILITOT 0.3  --  0.6  PROT 7.6  --  5.9*  ALBUMIN 3.7  --  2.4*  INR  --  1.34  --      INFECTIOUS  Recent Labs Lab 04/04/14 2022 04/05/14 1416 04/06/14 0500  LATICACIDVEN 3.0* 1.4 1.2     ENDOCRINE CBG (last 3)   Recent Labs  04/06/14 1936 04/07/14 0341 04/07/14 0743  GLUCAP 93 99 130*         IMAGING  x48h  Ct Chest Wo Contrast  04/06/2014   CLINICAL DATA:  Evaluate infiltrates. History of previous the eosinophilic pneumonia  EXAM: CT CHEST WITHOUT CONTRAST  TECHNIQUE: Multidetector CT imaging of the chest was performed following the standard protocol without IV contrast.  COMPARISON:  05/01/2008 chest CT  FINDINGS: THORACIC INLET/BODY WALL:  Endotracheal tube ends in the mid thoracic trachea. Right IJ central line, tip at the upper cavoatrial junction. There is an orogastric tube which enters the stomach at least. No acute findings.  MEDIASTINUM:  Normal heart size. No pericardial effusion. No acute vascular findings. Mild enlargement of the right lower peritracheal and subcarinal nodes. Small sliding-type hiatal hernia.  LUNG WINDOWS:  Dense consolidation in the right lower lobe, essentially involving the entire lobe. Debris noted within the associated airways. There are few punctate high-density foci within the right lower (also left lower) lobe, which could represent high-density debris, with high-density material also seen in the stomach. Less extensive opacity in the left lower lobe. On the left there is bronchial crowding compatible with volume loss and atelectasis. Reticular nodular opacities in the right suprahilar lung were also seen in 2009, consistent with scarring. The current findings are much more confluent and extensive that the pulmonary findings seen in 2009, when there was reported eosinophilic pneumonia. No effusion or cavitation.  UPPER ABDOMEN:  No acute findings.  OSSEOUS:  No acute fracture.  No suspicious lytic or blastic lesions.  IMPRESSION: 1. Bilateral lower lobe pneumonia, extensive on the right. 2. Consider aspiration as the inciting cause given the lower lobe pattern, hiatal hernia, and high-density debris in the lower lobes.   Electronically Signed   By: Jorje Guild M.D.   On: 04/06/2014 01:38   Dg Chest Port 1 View  04/07/2014   CLINICAL DATA:  Check endotracheal tube  position  EXAM: PORTABLE CHEST - 1 VIEW  COMPARISON:  04/06/2014  FINDINGS: The endotracheal tube is again noted 13 mm above the carina. This should be withdrawn 1-2 cm. A right jugular line is noted within the mid right atrium. A nasogastric catheter is noted within the stomach. The cardiac shadow is stable. Persistent right-sided infiltrate is again seen. Left retrocardiac atelectasis is noted. Diffuse increase in pulmonary vascular congestion is noted.  IMPRESSION: New pulmonary vascular congestion superimposed over significant right basilar infiltrate. New left basilar atelectasis.   Electronically Signed   By: Inez Catalina M.D.   On:  04/07/2014 06:55   Dg Chest Port 1 View  04/06/2014   CLINICAL DATA:  Check endotracheal tube, shortness of breath.  EXAM: PORTABLE CHEST - 1 VIEW  COMPARISON:  CT chest and chest radiograph 04/05/2014.  FINDINGS: The endotracheal tube terminates approximately 5 mm above the carina. Nasogastric tube is followed into the stomach with the tip projecting beyond the inferior margin of the image. Right IJ central line tip projects over the SVC RA junction. Heart size stable. Right perihilar and bibasilar airspace consolidation persist. No definite pleural effusions.  IMPRESSION: 1. Endotracheal tube appears low lying. Retracting approximately 1-2 cm should better position the tip above the carina. 2. Right perihilar and bibasilar airspace consolidation, worrisome for pneumonia, stable.   Electronically Signed   By: Lorin Picket M.D.   On: 04/06/2014 07:04     ASSESSMENT / PLAN:  PULMONARY A: 1) Acute hypoxic and hypercarbic respiratory failure - intubated 2) DDx - CAP, Acute CHF NOS, Eosinophilic pna (peripheral eos normal and BAL NOT c/w eos)  P:   -cell diff, c/w pus -wean cpap 5 ps 10 required, unable to reduce ps thus far -pcxr in am  -lasix  CARDIOVASCULAR A:  1) Circulatory shock In the differential CHF vs septic shock; COox 67 makes cardiogenic less  likely 2) Likely type 2 stress NSTEMI  P:  -tele, off pressors -map goal 60 -lasix will tolerate today likely  RENAL A:   1) Acute renal failure resolving 2 mild hypokalemia P:   - k supp -bmet in am  -start lasix  GASTROINTESTINAL A:   1) feeds P:   - IV Pepcid - continued TF to goal , success! -BM noted  HEMATOLOGIC A:   1) DVt prev, mild anemia P:  - DVT prophylaxis with SQ heparin -cbc in am   INFECTIOUS A:   1) CAP vs Unlikely reoccurence eos PNA P:   - maintain current abx regimen, follow bal to final -will narrow in am if remains culture neg from bronch -consider vanc dc -dc aline  ENDOCRINE A:   1) No history of diabetes 2) No recent use of prednisone P:  ssi  NEUROLOGIC A:   1) Somnolent but easily arousable. Non focal P:   Use fent prn -pt conuslt, ambulate? On vent  GLOBAL: weaning, PS 10, T fcontinued, lasix   Ccm time 30 min   [name]. Titus Mould, MD, [place] Pgr: [facility]

## 2014-04-07 NOTE — Progress Notes (Signed)
eLink Physician-Brief Progress Note Patient Name: Shirley Schneider DOB: 05-25-1946 MRN: [id number]  Date of Service  04/07/2014   HPI/Events of Note  Resp alkalosis due to excessive mandatory ventilation    eICU Interventions  Vent settings adjusted   Intervention Category Major Interventions: Acid-Base disturbance - evaluation and management;Respiratory failure - evaluation and management  Wilhelmina Mcardle 04/07/2014, 5:52 PM

## 2014-04-07 NOTE — Progress Notes (Signed)
[facility] for Vancomycin/Zosyn Indication: sepsis  Allergies  Allergen Reactions  . Aspirin     upstomach   Patient Measurements: Height: 5\' 3"  (160 cm) Weight: 240 lb 8.4 oz (109.1 kg) IBW/kg (Calculated) : 52.4  Vital Signs: Temp: 99.1 F (37.3 C) (06/02 0757) Temp src: Oral (06/02 0757) BP: 116/72 mmHg (06/02 0757) Pulse Rate: 90 (06/02 0757)  Labs:  Recent Labs  04/05/14 0200 04/05/14 1416 04/06/14 0500 04/07/14 0430  WBC 14.9*  --  13.3* 12.2*  HGB 11.5*  --  10.3* 8.7*  PLT 337  --  256 230  CREATININE 2.33* 1.71* 1.11* 0.97   Estimated Creatinine Clearance: 66.7 ml/min (by C-G formula based on Cr of 0.97).  Medical History: Past Medical History  Diagnosis Date  . Fibromyalgia   . Asthma   . Hypertension    Assessment: 68 year old female continues on broad spectrum antibiotics for rule out sepsis. Scr has improved Cultures negative to date  Still with fevers, s/p bronchoscopy, failed weaning trial  Goal of Therapy:  Vancomycin trough level 15-20 mcg/ml  Plan:  -Increase Vancomycin to 1 gram iv Q 12 hours (improved renal function -Continue Zosyn 3.375G IV q8h to be infused over 4 hours -Trend WBC, temp, renal function  -Drug levels as indicated   Thank you. Anette Guarneri, PharmD [phone]  Tad Moore 04/07/2014,8:59 AM

## 2014-04-08 ENCOUNTER — Inpatient Hospital Stay (HOSPITAL_COMMUNITY): Payer: Medicare Other

## 2014-04-08 LAB — BASIC METABOLIC PANEL
BUN: 28 mg/dL — AB (ref 6–23)
CHLORIDE: 104 meq/L (ref 96–112)
CO2: 25 mEq/L (ref 19–32)
Calcium: 8.6 mg/dL (ref 8.4–10.5)
Creatinine, Ser: 1.07 mg/dL (ref 0.50–1.10)
GFR calc Af Amer: 61 mL/min — ABNORMAL LOW (ref >90)
GFR calc non Af Amer: 52 mL/min — ABNORMAL LOW (ref >90)
GLUCOSE: 113 mg/dL — AB (ref 70–99)
POTASSIUM: 3 meq/L — AB (ref 3.7–5.3)
Sodium: 143 mEq/L (ref 137–147)

## 2014-04-08 LAB — CULTURE, RESPIRATORY W GRAM STAIN

## 2014-04-08 LAB — PHOSPHORUS: Phosphorus: 3 mg/dL (ref 2.3–4.6)

## 2014-04-08 LAB — GLUCOSE, CAPILLARY
GLUCOSE-CAPILLARY: 108 mg/dL — AB (ref 70–99)
Glucose-Capillary: 100 mg/dL — ABNORMAL HIGH (ref 70–99)
Glucose-Capillary: 113 mg/dL — ABNORMAL HIGH (ref 70–99)
Glucose-Capillary: 115 mg/dL — ABNORMAL HIGH (ref 70–99)
Glucose-Capillary: 121 mg/dL — ABNORMAL HIGH (ref 70–99)

## 2014-04-08 LAB — MAGNESIUM: Magnesium: 1.4 mg/dL — ABNORMAL LOW (ref 1.5–2.5)

## 2014-04-08 LAB — CULTURE, RESPIRATORY

## 2014-04-08 MED ORDER — FUROSEMIDE 10 MG/ML IJ SOLN
20.0000 mg | Freq: Every day | INTRAMUSCULAR | Status: DC
  Administered 2014-04-09 – 2014-04-10 (×2): 20 mg via INTRAVENOUS
  Filled 2014-04-08 (×3): qty 2

## 2014-04-08 MED ORDER — DEXTROSE 5 % IV SOLN
1.0000 g | INTRAVENOUS | Status: AC
  Administered 2014-04-08 – 2014-04-11 (×4): 1 g via INTRAVENOUS
  Filled 2014-04-08 (×4): qty 10

## 2014-04-08 MED ORDER — POTASSIUM CHLORIDE 20 MEQ/15ML (10%) PO LIQD
40.0000 meq | Freq: Once | ORAL | Status: AC
  Administered 2014-04-08: 40 meq
  Filled 2014-04-08: qty 30

## 2014-04-08 MED ORDER — MAGNESIUM SULFATE 50 % IJ SOLN
6.0000 g | Freq: Once | INTRAVENOUS | Status: AC
  Administered 2014-04-08: 6 g via INTRAVENOUS
  Filled 2014-04-08: qty 12

## 2014-04-08 NOTE — Evaluation (Signed)
Physical Therapy Evaluation Patient Details Name: Shirley Schneider MRN: [id number] DOB: 1946/10/07 Today's Date: 04/08/2014   History of Present Illness  Pt is a 68 y/o female admitted with right sided chest pain, SOB, pink frothy sputum. She is currently being managed for PNA, VDRF.  Clinical Impression  Pt admitted with the above. Pt currently with functional limitations due to the deficits listed below (see PT Problem List). At the time of PT eval pt was able to perform sitting balance at EOB without physical assist. Per RN request pt only transferred to EOB, however feel pt could have tolerated transfer to recliner. Will plan on OOB for next session if nursing agreeable. Pt will benefit from skilled PT to increase their independence and safety with mobility to allow discharge to the venue listed below.     Follow Up Recommendations CIR;Supervision/Assistance - 24 hour    Equipment Recommendations  Other (comment) (TBD)    Recommendations for Other Services Rehab consult     Precautions / Restrictions Precautions Precautions: Fall Restrictions Weight Bearing Restrictions: No      Mobility  Bed Mobility Overal bed mobility: Needs Assistance;+2 for physical assistance;+ 2 for safety/equipment Bed Mobility: Supine to Sit;Sit to Supine;Rolling Rolling: Max assist   Supine to sit: Min assist;+2 for physical assistance;+2 for safety/equipment;HOB elevated Sit to supine: Mod assist;+2 for physical assistance;+2 for safety/equipment   General bed mobility comments: Pt was able to assist with moving LE's toward EOB and with trunk elevation. +2 min assist for trunk support as transfer was initiated.   Transfers                    Ambulation/Gait                Stairs            Wheelchair Mobility    Modified Rankin (Stroke Patients Only)       Balance Overall balance assessment: Needs assistance Sitting-balance support: Feet supported;Bilateral upper  extremity supported Sitting balance-Leahy Scale: Fair Sitting balance - Comments: Pt required min-mod assist when first sitting EOB. Hand-over-hand assist to place UE's at sides for support, and pt was able to maintain sitting without assist.  Postural control: Posterior lean                                   Pertinent Vitals/Pain BP improved during sitting EOB - systolic in 277'A and diastolic in 12'I.    Home Living Family/patient expects to be discharged to:: Inpatient rehab                      Prior Function           Comments: Pt unable to answer history questions at this time due to being on ventillator.      Hand Dominance   Dominant Hand: Right    Extremity/Trunk Assessment   Upper Extremity Assessment: Defer to OT evaluation           Lower Extremity Assessment: Generalized weakness      Cervical / Trunk Assessment: Other exceptions  Communication   Communication: Other (comment) (On vent - shakes head yes or no, tries to give thumbs up)  Cognition Arousal/Alertness: Lethargic Behavior During Therapy: Flat affect Overall Cognitive Status: Difficult to assess  General Comments      Exercises General Exercises - Lower Extremity Long Arc Quad: 5 reps;Both      Assessment/Plan    PT Assessment Patient needs continued PT services  PT Diagnosis Difficulty walking;Generalized weakness   PT Problem List Decreased strength;Decreased range of motion;Decreased activity tolerance;Decreased balance;Decreased mobility;Decreased knowledge of use of DME;Decreased safety awareness;Decreased knowledge of precautions;Cardiopulmonary status limiting activity  PT Treatment Interventions DME instruction;Gait training;Stair training;Functional mobility training;Therapeutic activities;Therapeutic exercise;Neuromuscular re-education;Patient/family education   PT Goals (Current goals can be found in the Care Plan  section) Acute Rehab PT Goals Patient Stated Goal: Not stated PT Goal Formulation: Patient unable to participate in goal setting Time For Goal Achievement: 04/22/14 Potential to Achieve Goals: Good    Frequency Min 3X/week   Barriers to discharge        Co-evaluation               End of Session Equipment Utilized During Treatment: Oxygen Activity Tolerance: Patient limited by fatigue Patient left: in bed;with call bell/phone within reach;with nursing/sitter in room Nurse Communication: Mobility status         Time: 8676-1950 PT Time Calculation (min): 39 min   Charges:   PT Evaluation $Initial PT Evaluation Tier I: 1 Procedure PT Treatments $Therapeutic Activity: 23-37 mins   PT G CodesJolyn Lent 04/08/2014, 12:23 PM  Jolyn Lent, PT, DPT Acute Rehabilitation Services Pager: [phone]

## 2014-04-08 NOTE — Progress Notes (Signed)
Inpatient Rehabilitation  Note that PT is recommending CIR for rehab when pt. Is medically stable and tolerant to activity.  Please consider CIR consult when appropriate if you are agreeable.  Thank you.  Please call if questions.  [facility] Admissions Coordinator Cell [phone] Office [phone]

## 2014-04-08 NOTE — Progress Notes (Signed)
PULMONARY / CRITICAL CARE MEDICINE   Name: Shirley Schneider MRN: [id number] DOB: 1945-12-11    ADMISSION DATE:  04/04/2014  PRIMARY SERVICE: PCCM  CHIEF COMPLAINT:  Right sided chest pain, SOB, pink frothy sputum.  BRIEF PATIENT DESCRIPTION:   68 years old female with PMH relevant for asthma, HTN and fibromyalgia. 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 Being managed for PNA, VDRF.  SIGNIFICANT EVENTS / STUDIES:  04/04/2014 -04/04/2014 - Admit 6/1 off levophed 6/1 - bronch, extensive merky pus rt  6/2- weaned ps 10, neg 3.5 liters  LINES / TUBES: 5/30 rt ij>>> 5/30 ETT>>>  CULTURES: - Blood cultures 5/30>>> - Urine culture 5/20>>> - Sputum culture 5/30>>> - resp virus 04/05/14>>>neg - urine leg 04/05/14>>> - urine strep 04/05/14>>> 6/1 Bronch BAL>>>few yeast  Fungal>>>neg  Cell diff>>>  ANTIBIOTICS: 5/30 Rocephin>>>6/1 5/20 Azithromycin>>> 5/31 zosyn>>> 5/31 vanc>>>  SUBJECTIVE:  Neg 3.5 liters  VITAL SIGNS: Temp:  [98.3 F (36.8 C)-101.2 F (38.4 C)] 98.3 F (36.8 C) (06/03 0700) Pulse Rate:  [74-86] 80 (06/03 0300) Resp:  [16-29] 17 (06/03 0800) BP: (83-129)/(51-81) 101/54 mmHg (06/03 0800) SpO2:  [100 %] 100 % (06/03 0800) Arterial Line BP: (73-110)/(67-71) 73/67 mmHg (06/02 1000) FiO2 (%):  [40 %] 40 % (06/03 0300) Weight:  [106.4 kg (234 lb 9.1 oz)] 106.4 kg (234 lb 9.1 oz) (06/03 0500) HEMODYNAMICS:   VENTILATOR SETTINGS: Vent Mode:  [-] PRVC FiO2 (%):  [40 %] 40 % Set Rate:  [16 bmp-26 bmp] 16 bmp Vt Set:  [500 mL] 500 mL PEEP:  [5 cmH20] 5 cmH20 Pressure Support:  [10 cmH20] 10 cmH20 Plateau Pressure:  [20 cmH20-24 cmH20] 20 cmH20 INTAKE / OUTPUT: Intake/Output     06/02 0701 - 06/03 0700 06/03 0701 - 06/04 0700   I.V. (mL/kg) 426.9 (4) 15 (0.1)   NG/GT 1450    IV Piggyback 400    Total Intake(mL/kg) 2276.9 (21.4) 15 (0.1)   Urine (mL/kg/hr) 5725  (2.2)    Total Output 5725     Net -3448.1 +15          PHYSICAL EXAMINATION: General: vented Neuro: rass -1, follows commands, more awake oveall HEENT: ETT, obese neck PULM: ronchi  imrpoved CV: s1s2 RRR  GI: soft, BS wnml no r Extremities: edema gen   LABS:   PULMONARY  Recent Labs Lab 04/04/14 2240 04/05/14 0100 04/05/14 0253 04/05/14 0525 04/07/14 0326  PHART 7.205* 7.163* 7.257* 7.348* 7.476*  PCO2ART 48.4* 54.8* 37.1 28.6* 26.8*  PO2ART 93.4 86.0 76.0* 90.0 94.9  HCO3 18.4* 19.7* 16.6* 15.7* 19.6*  TCO2 19.9 21 18 17  20.4  O2SAT 67.3  98.4 93.0 93.0 97.0 97.6    CBC  Recent Labs Lab 04/05/14 0200 04/06/14 0500 04/07/14 0430  HGB 11.5* 10.3* 8.7*  HCT 37.6 32.2* 26.5*  WBC 14.9* 13.3* 12.2*  PLT 337 256 230    COAGULATION  Recent Labs Lab 04/06/14 0500  INR 1.34    CARDIAC    Recent Labs Lab 04/04/14 1605 04/04/14 2022 04/05/14 0200 04/05/14 0750  TROPONINI <0.30 0.45* 0.69* 0.77*    Recent Labs Lab 04/04/14 1605  PROBNP 6040.0*     CHEMISTRY  Recent Labs Lab 04/04/14 2022 04/05/14 0200 04/05/14 1416 04/06/14 0500 04/07/14 0430 04/08/14 0420  NA 137 135* 139 140 138 143  K 5.0 6.1* 3.9 3.5* 3.1* 3.0*  CL 102 102 104 105  106 104  CO2 17* 18* 17* 19 18* 25  GLUCOSE 85 86 77 92 106* 113*  BUN 38* 42* 40* 29* 26* 28*  CREATININE 2.17* 2.33* 1.71* 1.11* 0.97 1.07  CALCIUM 8.6 8.2* 8.7 8.7 8.1* 8.6  MG 1.8 1.8  --   --   --  1.4*  PHOS 4.2 4.9*  --   --   --  3.0   Estimated Creatinine Clearance: 59.6 ml/min (by C-G formula based on Cr of 1.07).   LIVER  Recent Labs Lab 04/04/14 1605 04/06/14 0500 04/07/14 0430  AST 16  --  15  ALT 5  --  5  ALKPHOS 104  --  108  BILITOT 0.3  --  0.6  PROT 7.6  --  5.9*  ALBUMIN 3.7  --  2.4*  INR  --  1.34  --      INFECTIOUS  Recent Labs Lab 04/04/14 2022 04/05/14 1416 04/06/14 0500  LATICACIDVEN 3.0* 1.4 1.2     ENDOCRINE CBG (last 3)   Recent Labs   04/07/14 2038 04/08/14 0006 04/08/14 0315  GLUCAP 115* 100* 108*         IMAGING x48h  Dg Chest Port 1 View  04/08/2014   CLINICAL DATA:  Assess endotracheal tube.  EXAM: PORTABLE CHEST - 1 VIEW  COMPARISON:  Chest radiograph April 07, 2014  FINDINGS: Endotracheal tube tip projects 2.1 cm above the carina. Right internal jugular central venous catheter distal tip projects the cavoatrial junction. Nasogastric tube past the proximal stomach, distal tip not imaged. Multiple EKG lines overlie the patient and may obscure subtle underlying pathology.  Cardiomediastinal silhouette is grossly normal, partially obscured by interstitial and alveolar airspace opacities, slightly decreased. Left lung base atelectasis. Small pleural effusions. No pneumothorax.  IMPRESSION: No apparent change in life-support lines.  Slightly decreased interstitial and alveolar air space opacities may reflect resolving pulmonary edema with small pleural effusions. Left lung base atelectasis.   Electronically Signed   By: Elon Alas   On: 04/08/2014 06:45   Dg Chest Port 1 View  04/07/2014   CLINICAL DATA:  Check endotracheal tube position  EXAM: PORTABLE CHEST - 1 VIEW  COMPARISON:  04/06/2014  FINDINGS: The endotracheal tube is again noted 13 mm above the carina. This should be withdrawn 1-2 cm. A right jugular line is noted within the mid right atrium. A nasogastric catheter is noted within the stomach. The cardiac shadow is stable. Persistent right-sided infiltrate is again seen. Left retrocardiac atelectasis is noted. Diffuse increase in pulmonary vascular congestion is noted.  IMPRESSION: New pulmonary vascular congestion superimposed over significant right basilar infiltrate. New left basilar atelectasis.   Electronically Signed   By: Inez Catalina M.D.   On: 04/07/2014 06:55     ASSESSMENT / PLAN:  PULMONARY A: 1) Acute hypoxic and hypercarbic respiratory failure 2) DDx - CAP 3 h/o of Eosinophilic pna  (peripheral eos normal and BAL NOT c/w eos)  P:   -wean cpap 5 ps goal 5, x 30 min , assess rsbi, hope neg balance has helped with weaning ability -pcxr in am  -lasix did well overnight -last abg reviewed, drop rate 12, control agitation affect on RR  CARDIOVASCULAR A:  1) Circulatory shock In the differential CHF vs septic shock; COox 67 makes cardiogenic less likely 2) Likely type 2 stress NSTEMI  P:  -tele, off pressors -map goal 60 -lasix will tolerate today likely  RENAL A:   1) Acute renal failure  resolving 2 mild hypokalemia, hypomagnesemia P:   - k supp x 2, supp mag iv -bmet in am with mag, phos -reduce lasix  GASTROINTESTINAL A:   1) feeds P:   - IV Pepcid - continued TF  HEMATOLOGIC A:   1) DVt prev, mild anemia P:  - DVT prophylaxis with SQ heparin -cbc in am   INFECTIOUS A:   1) CAP vs Unlikely reoccurence eos PNA P:   - azithro x 5 days -dc zosyn, add ceftriacone  ENDOCRINE A:   1) No history of diabetes 2) No recent use of prednisone P:  ssi  NEUROLOGIC A:   1) Somnolent but easily arousable. Non focal P:   Use fent prn -pt called, want to ambulate  GLOBAL: weaning, reduce lasix, narrow abx   Ccm time 30 min   [name]. Titus Mould, MD, [place] Pgr: [facility]

## 2014-04-09 ENCOUNTER — Inpatient Hospital Stay (HOSPITAL_COMMUNITY): Payer: Medicare Other

## 2014-04-09 LAB — PHOSPHORUS: Phosphorus: 3.6 mg/dL (ref 2.3–4.6)

## 2014-04-09 LAB — MAGNESIUM: Magnesium: 2.6 mg/dL — ABNORMAL HIGH (ref 1.5–2.5)

## 2014-04-09 LAB — CBC WITH DIFFERENTIAL/PLATELET
BASOS ABS: 0 10*3/uL (ref 0.0–0.1)
Basophils Relative: 0 % (ref 0–1)
EOS ABS: 0.2 10*3/uL (ref 0.0–0.7)
EOS PCT: 4 % (ref 0–5)
HCT: 26 % — ABNORMAL LOW (ref 36.0–46.0)
Hemoglobin: 8.4 g/dL — ABNORMAL LOW (ref 12.0–15.0)
Lymphocytes Relative: 27 % (ref 12–46)
Lymphs Abs: 1.7 10*3/uL (ref 0.7–4.0)
MCH: 28.9 pg (ref 26.0–34.0)
MCHC: 32.3 g/dL (ref 30.0–36.0)
MCV: 89.3 fL (ref 78.0–100.0)
Monocytes Absolute: 0.8 10*3/uL (ref 0.1–1.0)
Monocytes Relative: 12 % (ref 3–12)
Neutro Abs: 3.6 10*3/uL (ref 1.7–7.7)
Neutrophils Relative %: 57 % (ref 43–77)
Platelets: 267 10*3/uL (ref 150–400)
RBC: 2.91 MIL/uL — ABNORMAL LOW (ref 3.87–5.11)
RDW: 14.9 % (ref 11.5–15.5)
WBC: 6.3 10*3/uL (ref 4.0–10.5)

## 2014-04-09 LAB — BASIC METABOLIC PANEL
BUN: 36 mg/dL — AB (ref 6–23)
CHLORIDE: 102 meq/L (ref 96–112)
CO2: 26 mEq/L (ref 19–32)
CREATININE: 0.91 mg/dL (ref 0.50–1.10)
Calcium: 8.9 mg/dL (ref 8.4–10.5)
GFR calc non Af Amer: 64 mL/min — ABNORMAL LOW (ref >90)
GFR, EST AFRICAN AMERICAN: 74 mL/min — AB (ref >90)
Glucose, Bld: 119 mg/dL — ABNORMAL HIGH (ref 70–99)
Potassium: 3.6 mEq/L — ABNORMAL LOW (ref 3.7–5.3)
Sodium: 142 mEq/L (ref 137–147)

## 2014-04-09 LAB — POCT I-STAT 3, ART BLOOD GAS (G3+)
Acid-Base Excess: 3 mmol/L — ABNORMAL HIGH (ref 0.0–2.0)
Bicarbonate: 27.8 mEq/L — ABNORMAL HIGH (ref 20.0–24.0)
O2 SAT: 91 %
PCO2 ART: 43.5 mmHg (ref 35.0–45.0)
PO2 ART: 60 mmHg — AB (ref 80.0–100.0)
Patient temperature: 98.6
TCO2: 29 mmol/L (ref 0–100)
pH, Arterial: 7.414 (ref 7.350–7.450)

## 2014-04-09 LAB — GLUCOSE, CAPILLARY
GLUCOSE-CAPILLARY: 100 mg/dL — AB (ref 70–99)
GLUCOSE-CAPILLARY: 112 mg/dL — AB (ref 70–99)
Glucose-Capillary: 101 mg/dL — ABNORMAL HIGH (ref 70–99)
Glucose-Capillary: 106 mg/dL — ABNORMAL HIGH (ref 70–99)
Glucose-Capillary: 108 mg/dL — ABNORMAL HIGH (ref 70–99)
Glucose-Capillary: 112 mg/dL — ABNORMAL HIGH (ref 70–99)

## 2014-04-09 MED ORDER — ACETYLCYSTEINE 20 % IN SOLN
4.0000 mL | Freq: Two times a day (BID) | RESPIRATORY_TRACT | Status: DC
  Administered 2014-04-09 – 2014-04-10 (×3): 4 mL via RESPIRATORY_TRACT
  Filled 2014-04-09 (×6): qty 4

## 2014-04-09 NOTE — Progress Notes (Signed)
PULMONARY / CRITICAL CARE MEDICINE   Name: Shirley Schneider MRN: [id number] DOB: [birth year]    ADMISSION DATE:  04/04/2014  PRIMARY SERVICE: PCCM  CHIEF COMPLAINT:  Right sided chest pain, SOB, pink frothy sputum.  BRIEF PATIENT DESCRIPTION:   68 years old female with PMH relevant for asthma, HTN and fibromyalgia. 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 Being managed for PNA, VDRF. Weaning.  SIGNIFICANT EVENTS / STUDIES:  04/04/2014 -04/04/2014 - Admit 6/1 - Off levophed 6/1 - Bronch, extensive merky pus rt  6/2 - Weaned PS 10, neg 3.5 liters  LINES / TUBES: 5/30 R IJj>>> 5/30 ETT>>>  CULTURES: - Blood cultures 5/30>>>NGTD - Urine culture 5/20>>>neg - Sputum culture 5/30>>>few yeast - resp virus 04/05/14>>>neg - urine leg 04/05/14>>> - urine strep 04/05/14>>> 6/1 Bronch BAL>>>few yeast  Fungal>>>neg  Cell diff>>>  ANTIBIOTICS: 5/30 Rocephin>>>6/1, 6/3>>>plan 6/6 5/30 Azithromycin>>>6/4 5/31 Zosyn>>>6/3 5/31 Vanc>>>6/2  SUBJECTIVE:  Patient looks uncomfortable but less agitated. When I ask where she is hurting, she gestures to her throat (ETT).   VITAL SIGNS: Temp:  [98.7 F (37.1 C)-100.4 F (38 C)] 98.8 F (37.1 C) (06/04 0700) Pulse Rate:  [63-101] 76 (06/04 0700) Resp:  [15-29] 16 (06/04 0800) BP: (77-126)/(44-81) 111/73 mmHg (06/04 0800) SpO2:  [100 %] 100 % (06/04 0800) FiO2 (%):  [30 %-40 %] 30 % (06/04 0820) Weight:  [105.6 kg (232 lb 12.9 oz)] 105.6 kg (232 lb 12.9 oz) (06/04 0500) HEMODYNAMICS:   VENTILATOR SETTINGS: Vent Mode:  [-] CPAP;PSV FiO2 (%):  [30 %-40 %] 30 % Set Rate:  [16 bmp] 16 bmp Vt Set:  [500 mL] 500 mL PEEP:  [5 cmH20] 5 cmH20 Pressure Support:  [10 cmH20] 10 cmH20 Plateau Pressure:  [18 cmH20-22 cmH20] 18 cmH20 INTAKE / OUTPUT: Intake/Output     06/03 0701 - 06/04 0700 06/04 0701 - 06/05 0700   I.V. (mL/kg) 589.9 (5.6) 10 (0.1)    NG/GT 1520 50   IV Piggyback 300    Total Intake(mL/kg) 2409.9 (22.8) 60 (0.6)   Urine (mL/kg/hr) 1595 (0.6)    Total Output 1595     Net +814.9 +60         PHYSICAL EXAMINATION: General: Vented, uncomfortable-appearing, NAD Neuro: RASS -1, able to follow commands.  Alert, but slightly drowsy. Non-agitated. HEENT: ETT, obese neck PULM: Ronchi present but improved.  CV: S1 S2 RRR, pressures soft.  GI: Soft, NT. BS+. No guarding. Extremities: Mild edema, generalized. Pedal pulses present.  LABS:  PULMONARY  Recent Labs Lab 04/04/14 2240 04/05/14 0100 04/05/14 0253 04/05/14 0525 04/07/14 0326  PHART 7.205* 7.163* 7.257* 7.348* 7.476*  PCO2ART 48.4* 54.8* 37.1 28.6* 26.8*  PO2ART 93.4 86.0 76.0* 90.0 94.9  HCO3 18.4* 19.7* 16.6* 15.7* 19.6*  TCO2 19.9 21 18 17  20.4  O2SAT 67.3  98.4 93.0 93.0 97.0 97.6    CBC  Recent Labs Lab 04/06/14 0500 04/07/14 0430 04/09/14 0450  HGB 10.3* 8.7* 8.4*  HCT 32.2* 26.5* 26.0*  WBC 13.3* 12.2* 6.3  PLT 256 230 267    COAGULATION  Recent Labs Lab 04/06/14 0500  INR 1.34    CARDIAC    Recent Labs Lab 04/04/14 1605 04/04/14 2022 04/05/14 0200 04/05/14 0750  TROPONINI <0.30 0.45* 0.69* 0.77*    Recent Labs Lab 04/04/14 1605  PROBNP 6040.0*     CHEMISTRY  Recent Labs Lab 04/04/14 2022 04/05/14 0200 04/05/14  1416 04/06/14 0500 04/07/14 0430 04/08/14 0420 04/09/14 0450  NA 137 135* 139 140 138 143 142  K 5.0 6.1* 3.9 3.5* 3.1* 3.0* 3.6*  CL 102 102 104 105 106 104 102  CO2 17* 18* 17* 19 18* 25 26  GLUCOSE 85 86 77 92 106* 113* 119*  BUN 38* 42* 40* 29* 26* 28* 36*  CREATININE 2.17* 2.33* 1.71* 1.11* 0.97 1.07 0.91  CALCIUM 8.6 8.2* 8.7 8.7 8.1* 8.6 8.9  MG 1.8 1.8  --   --   --  1.4* 2.6*  PHOS 4.2 4.9*  --   --   --  3.0 3.6   Estimated Creatinine Clearance: 69.8 ml/min (by C-G formula based on Cr of 0.91).   LIVER  Recent Labs Lab 04/04/14 1605 04/06/14 0500 04/07/14 0430  AST 16  --   15  ALT 5  --  5  ALKPHOS 104  --  108  BILITOT 0.3  --  0.6  PROT 7.6  --  5.9*  ALBUMIN 3.7  --  2.4*  INR  --  1.34  --      INFECTIOUS  Recent Labs Lab 04/04/14 2022 04/05/14 1416 04/06/14 0500  LATICACIDVEN 3.0* 1.4 1.2     ENDOCRINE CBG (last 3)   Recent Labs  04/09/14 0032 04/09/14 0408 04/09/14 0737  GLUCAP 101* 108* 112*    IMAGING x48h  Dg Chest Port 1 View  04/09/2014   CLINICAL DATA:  Shortness of breath with history of asthma and hypertension  EXAM: PORTABLE CHEST - 1 VIEW  COMPARISON:  Portable chest x-ray of April 08, 2014  FINDINGS: The patient is rotated on today's exam. There is persistent increased density in both lungs in the perihilar and infrahilar regions. This has improved slightly since yesterday's study. The cardiac silhouette is mildly enlarged. The pulmonary vascularity is engorged and indistinct. The endotracheal tube tip lies approximately 2 cm above the crotch of the carina. The right internal jugular venous catheter tip lies in the region of the mid to distal SVC. The esophagogastric tube tip projects off the inferior margin of the image.  IMPRESSION: Allowing for differences in patient positioning there has been slight interval improvement in the aeration of both lungs with with a decrease in airspace disease bilaterally. The support tubes and lines are in appropriate position.   Electronically Signed   By: David  Martinique   On: 04/09/2014 07:48   Dg Chest Port 1 View  04/08/2014   CLINICAL DATA:  Assess endotracheal tube.  EXAM: PORTABLE CHEST - 1 VIEW  COMPARISON:  Chest radiograph April 07, 2014  FINDINGS: Endotracheal tube tip projects 2.1 cm above the carina. Right internal jugular central venous catheter distal tip projects the cavoatrial junction. Nasogastric tube past the proximal stomach, distal tip not imaged. Multiple EKG lines overlie the patient and may obscure subtle underlying pathology.  Cardiomediastinal silhouette is grossly normal,  partially obscured by interstitial and alveolar airspace opacities, slightly decreased. Left lung base atelectasis. Small pleural effusions. No pneumothorax.  IMPRESSION: No apparent change in life-support lines.  Slightly decreased interstitial and alveolar air space opacities may reflect resolving pulmonary edema with small pleural effusions. Left lung base atelectasis.   Electronically Signed   By: Elon Alas   On: 04/08/2014 06:45     ASSESSMENT / PLAN:  PULMONARY A: 1) Acute hypoxic and hypercarbic respiratory failure 2) DDx - CAP 3) H/o of Eosinophilic PNA (peripheral eos normal and BAL NOT c/w eos)  P:  - PEEP 5, 30%, RR 16, 500 mL, maintain - weaning attempts PS 10 , goal to 8 - PCXR today (6/4), concer is atx, may require re bronch is atx worsen and failed weaning further - Rpt PCXR in AM - Lasix dec to 20mg , to bid to neg balance if bp tolerates -add mucomysts, may need peep increase, add chest pt, repeat film in am   CARDIOVASCULAR A:  1) Circulatory shock In the differential CHF vs septic shock; COox 67 makes cardiogenic less likely 2) Likely type 2 stress NSTEMI  P:  - Tele, off pressors 5/30. Pressures soft, may need lasix hold -  MAP goal 60  RENAL A:   1) Acute renal failure resolving 2) Mild hypokalemia, hypomagnesemia P:   - Bmet in am  - Lasix reduced to 20mg  Q daily, but had pos balance, may need increase if bp tolerates  GASTROINTESTINAL A:   1) Tube Feeds P:   - IV Pepcid - Continue TF  HEMATOLOGIC A:   1) DVT prev, mild anemia P:  - DVT prophylaxis with SQ heparin - Rpt CBC am  INFECTIOUS A:   1) CAP vs Unlikely reoccurence eos PNA P:   - Continue azithromycin, stop 5 days - Ceftriaxone to total 8 days ABX  ENDOCRINE A:   1) No history of diabetes 2) No recent use of prednisone P:  SSI  NEUROLOGIC A:   1) Somnolent but easily arousable. Non-focal. P:   - Contine fentanyl PRN - PT recommending CIR for rehab when  stable -[facility]  GLOBAL: wean, add chest pt, mucoyts, may need repeat bronch   Stephanie M. Reese, PA-S  Ccm time 30 min  I have fully examined this patient and agree with above findings.    And edited in full  [name]. Titus Mould, MD, [place] Pgr: [facility]

## 2014-04-10 ENCOUNTER — Inpatient Hospital Stay (HOSPITAL_COMMUNITY): Payer: Medicare Other

## 2014-04-10 LAB — BASIC METABOLIC PANEL
BUN: 38 mg/dL — ABNORMAL HIGH (ref 6–23)
CO2: 28 mEq/L (ref 19–32)
CREATININE: 0.83 mg/dL (ref 0.50–1.10)
Calcium: 9 mg/dL (ref 8.4–10.5)
Chloride: 102 mEq/L (ref 96–112)
GFR calc non Af Amer: 71 mL/min — ABNORMAL LOW (ref >90)
GFR, EST AFRICAN AMERICAN: 83 mL/min — AB (ref >90)
Glucose, Bld: 96 mg/dL (ref 70–99)
POTASSIUM: 3.7 meq/L (ref 3.7–5.3)
Sodium: 141 mEq/L (ref 137–147)

## 2014-04-10 LAB — CULTURE, BLOOD (ROUTINE X 2)
CULTURE: NO GROWTH
Culture: NO GROWTH

## 2014-04-10 LAB — GLUCOSE, CAPILLARY
GLUCOSE-CAPILLARY: 98 mg/dL (ref 70–99)
Glucose-Capillary: 94 mg/dL (ref 70–99)
Glucose-Capillary: 116 mg/dL — ABNORMAL HIGH (ref 70–99)

## 2014-04-10 MED ORDER — PANTOPRAZOLE SODIUM 40 MG PO PACK
40.0000 mg | PACK | Freq: Every day | ORAL | Status: DC
  Filled 2014-04-10: qty 20

## 2014-04-10 MED ORDER — PHENOL 1.4 % MT LIQD: 1.0000 | OROMUCOSAL | Status: DC | PRN

## 2014-04-10 MED ORDER — ALUM & MAG HYDROXIDE-SIMETH 200-200-20 MG/5ML PO SUSP
15.0000 mL | ORAL | Status: DC | PRN
  Administered 2014-04-10: 15 mL via ORAL
  Filled 2014-04-10: qty 30

## 2014-04-10 NOTE — Progress Notes (Signed)
PULMONARY / CRITICAL CARE MEDICINE   Name: Shirley Schneider MRN: [id number] DOB: July 02, 1946    ADMISSION DATE:  04/04/2014  PRIMARY SERVICE: PCCM  CHIEF COMPLAINT:  Right sided chest pain, SOB, pink frothy sputum.  BRIEF PATIENT DESCRIPTION:   68 years old female with PMH relevant for asthma, HTN and fibromyalgia. 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 Being managed for PNA, VDRF. Weaned for a few hours 6/4, began to desaturate to 80s. Small amount of blood in subglottic secretions. Fentanyl increased overnight for pain.  SIGNIFICANT EVENTS / STUDIES:  04/04/2014 -04/04/2014 - Admit 6/1 - Off levophed 6/1 - Bronch, extensive merky pus rt  6/2 - Weaned PS 10, neg 3.5 liters 6/4 - desaturated at end of wean  LINES / TUBES: 5/30 R IJ>>> 5/30 ETT>>>  CULTURES: - Blood cultures 5/30>>>NGTD - Urine culture 5/20>>>neg - Sputum culture 5/30>>>few yeast - resp virus 04/05/14>>>neg - urine leg 04/05/14>>> - urine strep 04/05/14>>> 6/1 Bronch BAL>>>few yeast  Fungal>>>neg  Cell diff>>>  ANTIBIOTICS: 5/30 Rocephin>>>6/1, 6/3>>>plan 6/6 5/30 Azithromycin>>>6/4 5/31 Zosyn>>>6/3 5/31 Vanc>>>6/2  SUBJECTIVE:  Patient looks comfortable. She is alert and able to communicate via writing. When I ask if she is hurting, she gestures to her throat (ETT).   VITAL SIGNS: Temp:  [98.8 F (37.1 C)-99.7 F (37.6 C)] 99.2 F (37.3 C) (06/05 0400) Pulse Rate:  [76-100] 89 (06/05 0413) Resp:  [12-34] 12 (06/05 0700) BP: (78-152)/(54-88) 78/54 mmHg (06/05 0700) SpO2:  [91 %-100 %] 95 % (06/05 0700) FiO2 (%):  [30 %] 30 % (06/05 0413) Weight:  [104.3 kg (229 lb 15 oz)] 104.3 kg (229 lb 15 oz) (06/05 0400) HEMODYNAMICS:   VENTILATOR SETTINGS: Vent Mode:  [-] PRVC FiO2 (%):  [30 %] 30 % Set Rate:  [16 bmp] 16 bmp Vt Set:  [500 mL] 500 mL PEEP:  [5 cmH20] 5 cmH20 Pressure Support:  [5 cmH20-10  cmH20] 5 cmH20 Plateau Pressure:  [19 cmH20-26 cmH20] 22 cmH20 INTAKE / OUTPUT: Intake/Output     06/04 0701 - 06/05 0700 06/05 0701 - 06/06 0700   I.V. (mL/kg) 761.1 (7.3)    NG/GT 1305    IV Piggyback 50    Total Intake(mL/kg) 2116.1 (20.3)    Urine (mL/kg/hr) 2655 (1.1)    Total Output 2655     Net -538.9          Stool Occurrence 2 x     PHYSICAL EXAMINATION: General: Vented, comfortable, NAD. Neuro: RASS 0, alert and calm, able to follow commands and communicate via notepad. HEENT: ETT, obese neck. PULM: Breath sounds present bilaterally. Some ronchi present but improved. Secretions frothy and clear/white overnight.  Small amount of bloody subglottic secretions.  CV: S1S2, RRR w/o m/g/r. GI: Soft, NT. BS+. No guarding. Extremities: Mild edema, generalized. Pedal pulses present.  LABS:  PULMONARY  Recent Labs Lab 04/05/14 0100 04/05/14 0253 04/05/14 0525 04/07/14 0326 04/09/14 1147  PHART 7.163* 7.257* 7.348* 7.476* 7.414  PCO2ART 54.8* 37.1 28.6* 26.8* 43.5  PO2ART 86.0 76.0* 90.0 94.9 60.0*  HCO3 19.7* 16.6* 15.7* 19.6* 27.8*  TCO2 21 18 17  20.4 29  O2SAT 93.0 93.0 97.0 97.6 91.0    CBC  Recent Labs Lab 04/06/14 0500 04/07/14 0430 04/09/14 0450  HGB 10.3* 8.7* 8.4*  HCT 32.2* 26.5* 26.0*  WBC 13.3* 12.2* 6.3  PLT 256 230 267    COAGULATION  Recent Labs  Lab 04/06/14 0500  INR 1.34    CARDIAC    Recent Labs Lab 04/04/14 1605 04/04/14 2022 04/05/14 0200 04/05/14 0750  TROPONINI <0.30 0.45* 0.69* 0.77*    Recent Labs Lab 04/04/14 1605  PROBNP 6040.0*     CHEMISTRY  Recent Labs Lab 04/04/14 2022 04/05/14 0200  04/06/14 0500 04/07/14 0430 04/08/14 0420 04/09/14 0450 04/10/14 0500  NA 137 135*  < > 140 138 143 142 141  K 5.0 6.1*  < > 3.5* 3.1* 3.0* 3.6* 3.7  CL 102 102  < > 105 106 104 102 102  CO2 17* 18*  < > 19 18* 25 26 28   GLUCOSE 85 86  < > 92 106* 113* 119* 96  BUN 38* 42*  < > 29* 26* 28* 36* 38*  CREATININE  2.17* 2.33*  < > 1.11* 0.97 1.07 0.91 0.83  CALCIUM 8.6 8.2*  < > 8.7 8.1* 8.6 8.9 9.0  MG 1.8 1.8  --   --   --  1.4* 2.6*  --   PHOS 4.2 4.9*  --   --   --  3.0 3.6  --   < > = values in this interval not displayed. Estimated Creatinine Clearance: 76 ml/min (by C-G formula based on Cr of 0.83).   LIVER  Recent Labs Lab 04/04/14 1605 04/06/14 0500 04/07/14 0430  AST 16  --  15  ALT 5  --  5  ALKPHOS 104  --  108  BILITOT 0.3  --  0.6  PROT 7.6  --  5.9*  ALBUMIN 3.7  --  2.4*  INR  --  1.34  --      INFECTIOUS  Recent Labs Lab 04/04/14 2022 04/05/14 1416 04/06/14 0500  LATICACIDVEN 3.0* 1.4 1.2     ENDOCRINE CBG (last 3)   Recent Labs  04/09/14 2053 04/10/14 0024 04/10/14 0437  GLUCAP 112* 98 94    IMAGING x48h  Dg Chest Port 1 View  04/09/2014   CLINICAL DATA:  Shortness of breath with history of asthma and hypertension  EXAM: PORTABLE CHEST - 1 VIEW  COMPARISON:  Portable chest x-ray of April 08, 2014  FINDINGS: The patient is rotated on today's exam. There is persistent increased density in both lungs in the perihilar and infrahilar regions. This has improved slightly since yesterday's study. The cardiac silhouette is mildly enlarged. The pulmonary vascularity is engorged and indistinct. The endotracheal tube tip lies approximately 2 cm above the crotch of the carina. The right internal jugular venous catheter tip lies in the region of the mid to distal SVC. The esophagogastric tube tip projects off the inferior margin of the image.  IMPRESSION: Allowing for differences in patient positioning there has been slight interval improvement in the aeration of both lungs with with a decrease in airspace disease bilaterally. The support tubes and lines are in appropriate position.   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/w eos)  P:  - PEEP 5, 30%, RR 16, 500 mL, maintain when on rest - Weaning attempts PS 5 done, RSBI, good, if desat may consider extubation to higher fio2 - PCXR now to assess atx - Rpt PCXR in AM - Lasix continued as BP tolerated - Mucomyst BID via neb, allow  to dc after 4th dose - chest pt  CARDIOVASCULAR A:  1) Circulatory shock In the differential CHF vs septic shock; COox 67 makes cardiogenic less likely 2) Likely type 2 stress NSTEMI  P:  - Tele -  MAP hold parameter with lasix  RENAL A:   1) Acute renal failure resolving 2) Mild hypokalemia, hypomagnesemia P:   - Bmet 6/4 with slight inc BUN - Rpt Bmet in AM - Lasix current dose maintain  GASTROINTESTINAL A:   1) Tube Feeds P:   - IV Pepcid - hold TF for weaning  HEMATOLOGIC A:   1) DVT prev, mild anemia P:  - DVT prophylaxis with SQ heparin - CBC 6/5>>> - Rpt CBC am, limit blood drwas as able  INFECTIOUS A:   1) CAP vs Unlikely reoccurence eos PNA P:   - Ceftriaxone to total 8 days ABX  ENDOCRINE A:   1) No history of diabetes 2) No recent use of prednisone P:  SSI  NEUROLOGIC A:   1) Somnolent but easily arousable. Non-focal. P:   - Contine fentanyl PRN (increased overnight 6/4 due to pain), monitor pressures - PT recommending CIR for rehab when stable - WUA  GLOBAL: Wean, plan extubation if able, lasix maintain   Lowella Dell. Reese, PA-S  Ccm time 30 min   I have fully examined this patient and agree with above findings.    And edited in full  [name]. Titus Mould, MD, [place] Pgr: [facility]

## 2014-04-10 NOTE — Progress Notes (Signed)
04/10/2014- Resp care note- Pt suctioned and extubated to 4lpm nasal cannula at 1105 as per MD order.  Vital signs post extubation- hr93, rr18, bp 134/94, sats98% on 4lpm nasal cannula.  Pt with a good, productive cough post extubation.  Pt tolerated procedure well with pt vocalizing post extubation.  Ventilator and ambu bag at bedside.  Will monitor progress and wean as tolerated.  s Moriyah Byington rrt, rcp

## 2014-04-10 NOTE — Progress Notes (Signed)
NUTRITION FOLLOW UP  Intervention:   - Advance diet per MD as tolerated - RD will continue to monitor for nutrition care plan  Nutrition Dx:   Inadequate oral intake related to inability to eat as evidenced by NPO status; ongoing, but pt has been extubated  Goal:   Pt to meet >/= 90% of their estimated nutrition needs; not met  Monitor:   Weight trends, lab trends, I/O's, toleration of diet advancement  Assessment:   PMHx significant for asthma, HTN, fibromyalgia. Admitted with SOB and cough with production of pink frothy sputum. Work-up reveals acute respiratory failure.  - Intubated 5/31. - Extubated 6/5. - Pt reports that she was eating well prior to hospital admission and has had no recent weight changes. She says that she will be able to eat on her own once diet is advanced.  Height: Ht Readings from Last 1 Encounters:  04/05/14 $RemoveB'5\' 3"'fdPLWLjb$  (1.6 m)    Weight Status:   Wt Readings from Last 1 Encounters:  04/10/14 229 lb 15 oz (104.3 kg)    Re-estimated needs:  Kcal: 1600-1850 Protein: 65-80 g Fluid: 1.6-1.9 L/day  Skin: Stage II pressure ulcer on buttocks  Diet Order: NPO   Intake/Output Summary (Last 24 hours) at 04/10/14 1334 Last data filed at 04/10/14 1200  Gross per 24 hour  Intake 1906.13 ml  Output   3230 ml  Net -1323.87 ml    Last BM: 6/4   Labs:   Recent Labs Lab 04/05/14 0200  04/08/14 0420 04/09/14 0450 04/10/14 0500  NA 135*  < > 143 142 141  K 6.1*  < > 3.0* 3.6* 3.7  CL 102  < > 104 102 102  CO2 18*  < > $R'25 26 28  'hp$ BUN 42*  < > 28* 36* 38*  CREATININE 2.33*  < > 1.07 0.91 0.83  CALCIUM 8.2*  < > 8.6 8.9 9.0  MG 1.8  --  1.4* 2.6*  --   PHOS 4.9*  --  3.0 3.6  --   GLUCOSE 86  < > 113* 119* 96  < > = values in this interval not displayed.  CBG (last 3)   Recent Labs  04/10/14 0024 04/10/14 0437 04/10/14 0852  GLUCAP 98 94 116*    Scheduled Meds: . acetylcysteine  4 mL Nebulization BID  . antiseptic oral rinse  15 mL  Mouth Rinse QID  . cefTRIAXone (ROCEPHIN)  IV  1 g Intravenous Q24H  . chlorhexidine  15 mL Mouth Rinse BID  . feeding supplement (PRO-STAT SUGAR FREE 64)  30 mL Per Tube BID  . feeding supplement (VITAL HIGH PROTEIN)  1,000 mL Per Tube Q24H  . furosemide  20 mg Intravenous Daily  . heparin  5,000 Units Subcutaneous 3 times per day  . [START ON 04/11/2014] pantoprazole sodium  40 mg Per Tube Daily    Continuous Infusions: . fentaNYL infusion INTRAVENOUS 100 mcg/hr (04/10/14 0700)    Terrace Arabia RD, LDN

## 2014-04-10 NOTE — Progress Notes (Signed)
Fentanyl IV infusion wasted per protocol with RN x 2 witness; 211mL wasted in sink-

## 2014-04-10 NOTE — Progress Notes (Signed)
Physical Therapy Treatment Patient Details Name: Shirley Schneider MRN: [id number] DOB: 07/27/1946 Today's Date: 04/10/2014    History of Present Illness Pt is a 68 y/o female admitted with right sided chest pain, SOB, pink frothy sputum. She is currently being managed for PNA, VDRF - extubated 04/10/14.    PT Comments    Pt progressing towards physical therapy goals. Able to transition bed>recliner today and vitals remained stable. Anticipate that pt will continue to progress well. Will continue to follow.   Follow Up Recommendations  CIR;Supervision/Assistance - 24 hour     Equipment Recommendations  None recommended by PT (Has RW at home)    Recommendations for Other Services Rehab consult     Precautions / Restrictions Precautions Precautions: Fall Restrictions Weight Bearing Restrictions: No    Mobility  Bed Mobility Overal bed mobility: Needs Assistance Bed Mobility: Supine to Sit     Supine to sit: Min guard     General bed mobility comments: Pt able to transition to EOB with min guard assist, use of bed rails for support, and increased time to scoot hips to EOB.   Transfers Overall transfer level: Needs assistance Equipment used: Rolling walker (2 wheeled) Transfers: Sit to/from [facility] Sit to Stand: Mod assist;+2 physical assistance Stand pivot transfers: Min assist;+2 physical assistance;+2 safety/equipment       General transfer comment: Pt able to power-up to full standing with +2 assist and increased time. Pt uses walker at home and asks for RW for UE support. VC's for improved posture however unable to stand fully erect. Increased time to take a few side steps and pivot around to the chair.   Ambulation/Gait                 Stairs            Wheelchair Mobility    Modified Rankin (Stroke Patients Only)       Balance Overall balance assessment: Needs assistance Sitting-balance support: Feet supported;Bilateral  upper extremity supported Sitting balance-Leahy Scale: Fair Sitting balance - Comments: Able to hold sitting balance without UE support for short amounts of time (~30 seconds).   Standing balance support: Bilateral upper extremity supported Standing balance-Leahy Scale: Poor                      Cognition Arousal/Alertness: Awake/alert Behavior During Therapy: Flat affect Overall Cognitive Status: Within Functional Limits for tasks assessed                      Exercises      General Comments        Pertinent Vitals/Pain Vitals stable throughout session with O2 remaining in high 90's on supplemental O2.     Home Living                      Prior Function            PT Goals (current goals can now be found in the care plan section) Acute Rehab PT Goals Patient Stated Goal: Not stated PT Goal Formulation: With patient Time For Goal Achievement: 04/22/14 Potential to Achieve Goals: Good Progress towards PT goals: Progressing toward goals    Frequency  Min 3X/week    PT Plan Current plan remains appropriate    Co-evaluation             End of Session Equipment Utilized During Treatment: Gait belt;Oxygen Activity Tolerance: Patient limited  by fatigue Patient left: in chair;with call bell/phone within reach     Time: 1431-1501 PT Time Calculation (min): 30 min  Charges:  $Gait Training: 8-22 mins $Therapeutic Activity: 8-22 mins                    G Codes:      Jolyn Lent May 09, 2014, 3:27 PM  Jolyn Lent, PT, DPT Acute Rehabilitation Services Pager: [phone]

## 2014-04-11 ENCOUNTER — Inpatient Hospital Stay (HOSPITAL_COMMUNITY): Payer: Medicare Other

## 2014-04-11 LAB — CBC WITH DIFFERENTIAL/PLATELET
Basophils Absolute: 0 10*3/uL (ref 0.0–0.1)
Basophils Relative: 0 % (ref 0–1)
Eosinophils Absolute: 0.3 10*3/uL (ref 0.0–0.7)
Eosinophils Relative: 4 % (ref 0–5)
HEMATOCRIT: 30.6 % — AB (ref 36.0–46.0)
HEMOGLOBIN: 9.5 g/dL — AB (ref 12.0–15.0)
Lymphocytes Relative: 28 % (ref 12–46)
Lymphs Abs: 2.2 10*3/uL (ref 0.7–4.0)
MCH: 28 pg (ref 26.0–34.0)
MCHC: 31 g/dL (ref 30.0–36.0)
MCV: 90.3 fL (ref 78.0–100.0)
MONO ABS: 0.8 10*3/uL (ref 0.1–1.0)
MONOS PCT: 10 % (ref 3–12)
Neutro Abs: 4.5 10*3/uL (ref 1.7–7.7)
Neutrophils Relative %: 58 % (ref 43–77)
Platelets: 466 10*3/uL — ABNORMAL HIGH (ref 150–400)
RBC: 3.39 MIL/uL — AB (ref 3.87–5.11)
RDW: 14.8 % (ref 11.5–15.5)
WBC: 7.9 10*3/uL (ref 4.0–10.5)

## 2014-04-11 LAB — BASIC METABOLIC PANEL
BUN: 29 mg/dL — AB (ref 6–23)
CHLORIDE: 102 meq/L (ref 96–112)
CO2: 27 mEq/L (ref 19–32)
Calcium: 9.5 mg/dL (ref 8.4–10.5)
Creatinine, Ser: 0.73 mg/dL (ref 0.50–1.10)
GFR calc non Af Amer: 86 mL/min — ABNORMAL LOW (ref >90)
GLUCOSE: 89 mg/dL (ref 70–99)
Potassium: 4 mEq/L (ref 3.7–5.3)
SODIUM: 144 meq/L (ref 137–147)

## 2014-04-11 MED ORDER — BUDESONIDE-FORMOTEROL FUMARATE 160-4.5 MCG/ACT IN AERO
2.0000 | INHALATION_SPRAY | Freq: Two times a day (BID) | RESPIRATORY_TRACT | Status: DC
  Administered 2014-04-11 – 2014-04-13 (×5): 2 via RESPIRATORY_TRACT
  Filled 2014-04-11 (×2): qty 6

## 2014-04-11 MED ORDER — OXYCODONE HCL 5 MG PO TABS
5.0000 mg | ORAL_TABLET | Freq: Four times a day (QID) | ORAL | Status: DC | PRN
  Administered 2014-04-11 – 2014-04-14 (×9): 5 mg via ORAL
  Filled 2014-04-11 (×9): qty 1

## 2014-04-11 MED ORDER — SODIUM CHLORIDE 0.9 % IJ SOLN
10.0000 mL | INTRAMUSCULAR | Status: DC | PRN
  Administered 2014-04-11 – 2014-04-13 (×4): 30 mL

## 2014-04-11 MED ORDER — HYDROCHLOROTHIAZIDE 25 MG PO TABS
25.0000 mg | ORAL_TABLET | Freq: Every day | ORAL | Status: DC
  Administered 2014-04-11 – 2014-04-14 (×4): 25 mg via ORAL
  Filled 2014-04-11 (×4): qty 1

## 2014-04-11 NOTE — Progress Notes (Signed)
Noted pt extubated. Please place an order for an inpt rehab consult to assess for possible admission. [phone]

## 2014-04-11 NOTE — Progress Notes (Signed)
Report to [facility]

## 2014-04-11 NOTE — Progress Notes (Signed)
PULMONARY / CRITICAL CARE MEDICINE  Name: ORINE GOGA MRN: [id number] DOB: 16-Dec-1945    ADMISSION DATE:  04/04/2014  PRIMARY SERVICE: PCCM  CHIEF COMPLAINT:  Respiratory failure  BRIEF PATIENT DESCRIPTION: 68 yo with asthma and eosinophilic pneumonia ( 7035, Ramaswamy ) admitted with pneumonia and respiratory failure.   SIGNIFICANT EVENTS / STUDIES:  5/31  TTE >>> EF 65%, no RWMA   6/1    Bronchoscopy, purulent secretion 6/4    Extubated  LINES / TUBES: R IJ CVL 5/30 >>> 6/6 ETT 5/30 >>> 6/5  CULTURES: 5/30  Blood >>> neg 5/31  Urine >>> neg 5/30  Sputum >>> neg 5/31  Respiratory viral panel >>> neg  ANTIBIOTICS: Rocephin 5/30 >>>6/1, 6/3 >>> Azithromycin 5/30 >>> 6/4 Zosyn 5/31 >>> 6/3 Vancomycin 5/31 >>> 6/2  INTERVAL HISTORY:  Remains extubated, getting out of bed, tolerating ice chips  VITAL SIGNS: Temp:  [97.9 F (36.6 C)-99.1 F (37.3 C)] 98.9 F (37.2 C) (06/06 0400) Pulse Rate:  [97] 97 (06/05 2109) Resp:  [8-34] 21 (06/06 0600) BP: (112-189)/(68-118) 182/97 mmHg (06/06 0600) SpO2:  [92 %-100 %] 99 % (06/06 0600) FiO2 (%):  [30 %] 30 % (06/05 0946) Weight:  [100.5 kg (221 lb 9 oz)] 100.5 kg (221 lb 9 oz) (06/06 0400) HEMODYNAMICS:   VENTILATOR SETTINGS: Vent Mode:  [-]  FiO2 (%):  [30 %] 30 % INTAKE / OUTPUT: Intake/Output     06/05 0701 - 06/06 0700 06/06 0701 - 06/07 0700   I.V. (mL/kg) 190 (1.9)    NG/GT 50    IV Piggyback 50    Total Intake(mL/kg) 290 (2.9)    Urine (mL/kg/hr) 2215 (0.9)    Total Output 2215     Net -1925           PHYSICAL EXAMINATION: General:  No distress Neuro:  Awake, alert HEENT:  No JVD Cardiovascular:  RRR, no m/r/g Lungs:  Bilateral diminished air entry Abdomen:  Soft, nontender, bowel sounds diminished Musculoskeletal:  Moves all extremities, no edema Skin:  Intact  LABS: CBC  Recent Labs Lab 04/07/14 0430 04/09/14 0450 04/11/14 0400  WBC 12.2* 6.3 7.9  HGB 8.7* 8.4* 9.5*  HCT 26.5* 26.0*  30.6*  PLT 230 267 466*   Coag's  Recent Labs Lab 04/06/14 0500  INR 1.34   BMET  Recent Labs Lab 04/09/14 0450 04/10/14 0500 04/11/14 0400  NA 142 141 144  K 3.6* 3.7 4.0  CL 102 102 102  CO2 26 28 27   BUN 36* 38* 29*  CREATININE 0.91 0.83 0.73  GLUCOSE 119* 96 89   Electrolytes  Recent Labs Lab 04/05/14 0200  04/08/14 0420 04/09/14 0450 04/10/14 0500 04/11/14 0400  CALCIUM 8.2*  < > 8.6 8.9 9.0 9.5  MG 1.8  --  1.4* 2.6*  --   --   PHOS 4.9*  --  3.0 3.6  --   --   < > = values in this interval not displayed.  Sepsis Markers  Recent Labs Lab 04/04/14 2022 04/05/14 1416 04/06/14 0500  LATICACIDVEN 3.0* 1.4 1.2   ABG  Recent Labs Lab 04/05/14 0525 04/07/14 0326 04/09/14 1147  PHART 7.348* 7.476* 7.414  PCO2ART 28.6* 26.8* 43.5  PO2ART 90.0 94.9 60.0*   Liver Enzymes  Recent Labs Lab 04/04/14 1605 04/07/14 0430  AST 16 15  ALT 5 5  ALKPHOS 104 108  BILITOT 0.3 0.6  ALBUMIN 3.7 2.4*   Cardiac Enzymes  Recent Labs Lab 04/04/14 1605  04/04/14 2022 04/05/14 0200 04/05/14 0750  TROPONINI <0.30 0.45* 0.69* 0.77*  PROBNP 6040.0*  --   --   --    Glucose  Recent Labs Lab 04/09/14 1127 04/09/14 1724 04/09/14 2053 04/10/14 0024 04/10/14 0437 04/10/14 0852  GLUCAP 106* 100* 112* 98 94 116*   IMAGING:  Dg Chest Port 1 View  04/11/2014   CLINICAL DATA:  Atelectasis  EXAM: PORTABLE CHEST - 1 VIEW  COMPARISON:  04/10/2014  FINDINGS: Patchy opacity in the right perihilar region and bilateral lower lobes, improved, possibly reflecting improving pneumonia or interstitial edema. Atelectasis is also possible. No definite pleural effusion or pneumothorax.  Cardiomegaly.  Stable right IJ venous catheter terminating in the right atrium.  IMPRESSION: Improving patchy opacity in the right perihilar region and bilateral lower lobes, possibly reflecting improving pneumonia or interstitial edema. Atelectasis also possible.   Electronically Signed    By: Julian Hy M.D.   On: 04/11/2014 07:12   Dg Chest Port 1 View  04/10/2014   CLINICAL DATA:  Evaluate atelectasis.  Respiratory distress.  EXAM: PORTABLE CHEST - 1 VIEW  FINDINGS: Low lung volumes. ET tube appears too low 10 mm above carina. Worsening right lung opacity, likely pneumonia with right basilar atelectasis. Overlying telemetry leads. Central venous catheter tip SVC. Nasogastric tube in the stomach.  IMPRESSION: Worsening aeration. No pneumothorax. ET tube too low, pull back 2-3 cm.   Electronically Signed   By: Rolla Flatten M.D.   On: 04/10/2014 09:39   ASSESSMENT / PLAN:  PULMONARY A: Acute respiratory failure CAP H/o of Eosinophilic Pneumonia (peripheral eosinophils normal this admission and BAL not consistent with this Dx ) H/o Asthma, no acute bronchospasm P:  Goal SpO2>92 Supplemental oxygen D/c Mucomyst Albuterol PRN Restart Symbicort  CARDIOVASCULAR A:  Shock initially, resolved NSTEMI vs demands ischemia H/o HTN P:  Hold Atenolol for now D/c CVL  RENAL A:   AKI, resolved Negative stay balance ( - 3.8 L ) P:   Trend BMP D/c Lasix Restart HCTZ  GASTROINTESTINAL A:   GI Px is not indicated P:   Advance diet  D/c Protonix  HEMATOLOGIC A:   Anemia VTE Px P:  Trend CBC Heparin  INFECTIOUS A:   CAP P:   Abx as above  ENDOCRINE A:   No active issues P: No interventions required  NEUROLOGIC A:   No active issues P:   D/c Fentanyl / Versed  I have personally obtained history, examined patient, evaluated and interpreted laboratory and imaging results, reviewed medical records, formulated assessment / plan and placed orders.   Doree Fudge, MD Pulmonary and [place] Pager: [phone]  04/11/2014, 8:45 AM

## 2014-04-12 LAB — LEGIONELLA PROFILE(CULTURE+DFA/SMEAR): LEGIONELLA ANTIGEN (DFA): NEGATIVE

## 2014-04-12 NOTE — Progress Notes (Signed)
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   SIGNIFICANT EVENTS / STUDIES:  5/31  TTE >>> EF 65%, no RWMA   6/1    Bronchoscopy, purulent secretion 6/4    Extubated  LINES / TUBES: R IJ CVL 5/30 >>> 6/6 ETT 5/30 >>> 6/5  CULTURES: 5/30  Blood >>> neg 5/31  Urine >>> neg 5/30  Sputum >>> neg 5/31  Respiratory viral panel >>> neg  ANTIBIOTICS: Rocephin 5/30 >>>6/1, 6/3 >>> Azithromycin 5/30 >>> 6/4 Zosyn 5/31 >>> 6/3 Vancomycin 5/31 >>> 6/2  INTERVAL HISTORY:  Says baseline at home is daily cough, clear phlegm,limited activity. Asks pain med for sore throat, back pain. Uses hydrocodone at home.  VITAL SIGNS: Temp:  [97.5 F (36.4 C)-98.6 F (37 C)] 98.5 F (36.9 C) (06/07 0937) Pulse Rate:  [73-94] 73 (06/07 0937) Resp:  [18-23] 18 (06/07 0937) BP: (126-156)/(76-99) 129/76 mmHg (06/07 0937) SpO2:  [95 %-100 %] 99 % (06/07 1014) Weight:  [101.4 kg (223 lb 8.7 oz)-102.014 kg (224 lb 14.4 oz)] 101.4 kg (223 lb 8.7 oz) (06/07 0511) HEMODYNAMICS:   VENTILATOR SETTINGS:   INTAKE / OUTPUT: Intake/Output     06/06 0701 - 06/07 0700 06/07 0701 - 06/08 0700   P.O. 990    I.V. (mL/kg)  30 (0.3)   NG/GT     IV Piggyback 50    Total Intake(mL/kg) 1040 (10.3) 30 (0.3)   Urine (mL/kg/hr) 1200 (0.5) 400 (1.1)   Stool 1 (0)    Total Output 1201 400   Net -161 -370        Urine Occurrence 1 x    Stool Occurrence 1 x     PHYSICAL EXAMINATION: General:  No distress, obese, sitting edge of bed Neuro:  Awake, alert HEENT:  No JVD Cardiovascular:  RRR, no m/r/g Lungs:  Bilateral diminished air entry, few crackles in bases, O2 Abdomen:  Soft, nontender, bowel sounds diminished Musculoskeletal:   Moves all extremities, no edema Skin:  Intact  LABS: CBC  Recent Labs Lab 04/07/14 0430 04/09/14 0450 04/11/14 0400  WBC 12.2* 6.3 7.9  HGB 8.7* 8.4* 9.5*  HCT 26.5* 26.0* 30.6*  PLT 230 267 466*   Coag's  Recent Labs Lab 04/06/14 0500  INR 1.34   BMET  Recent Labs Lab 04/09/14 0450 04/10/14 0500 04/11/14 0400  NA 142 141 144  K 3.6* 3.7 4.0  CL 102 102 102  CO2 26 28 27   BUN 36* 38* 29*  CREATININE 0.91 0.83 0.73  GLUCOSE 119* 96 89   Electrolytes  Recent Labs Lab 04/08/14 0420 04/09/14 0450 04/10/14 0500 04/11/14 0400  CALCIUM 8.6 8.9 9.0 9.5  MG 1.4* 2.6*  --   --   PHOS 3.0 3.6  --   --     Sepsis Markers  Recent Labs Lab 04/05/14 1416 04/06/14 0500  LATICACIDVEN 1.4 1.2   ABG  Recent Labs Lab 04/07/14 0326 04/09/14 1147  PHART 7.476* 7.414  PCO2ART 26.8* 43.5  PO2ART 94.9 60.0*   Liver Enzymes  Recent Labs Lab 04/07/14 0430  AST 15  ALT 5  ALKPHOS 108  BILITOT 0.6  ALBUMIN 2.4*   Cardiac Enzymes No results found for this basename: TROPONINI, PROBNP,  in  the last 168 hours Glucose  Recent Labs Lab 04/09/14 1127 04/09/14 1724 04/09/14 2053 04/10/14 0024 04/10/14 0437 04/10/14 0852  GLUCAP 106* 100* 112* 98 94 116*   IMAGING:  Dg Chest Port 1 View  04/11/2014   CLINICAL DATA:  Atelectasis  EXAM: PORTABLE CHEST - 1 VIEW  COMPARISON:  04/10/2014  FINDINGS: Patchy opacity in the right perihilar region and bilateral lower lobes, improved, possibly reflecting improving pneumonia or interstitial edema. Atelectasis is also possible. No definite pleural effusion or pneumothorax.  Cardiomegaly.  Stable right IJ venous catheter terminating in the right atrium.  IMPRESSION: Improving patchy opacity in the right perihilar region and bilateral lower lobes, possibly reflecting improving pneumonia or interstitial edema. Atelectasis also possible.   Electronically Signed   By: Julian Hy M.D.   On: 04/11/2014 07:12    ASSESSMENT / PLAN:  PULMONARY A: Acute respiratory failure CAP- viral panel neg, routine cx not final H/o of Eosinophilic Pneumonia (peripheral eosinophils normal this admission and BAL not consistent with this Dx ). Likely resolved H/o Asthma, no acute bronchospasm P:  Goal SpO2>92 Supplemental oxygen D/c Mucomyst Albuterol PRN Restart Symbicort -For 6/8- downstairs 2V CXR  CARDIOVASCULAR A:  Shock initially, resolved NSTEMI vs demands ischemia H/o HTN P:  Hold Atenolol for now D/c CVL  RENAL A:   AKI, resolved Negative stay balance ( - 3.8 L ) P:   Trend BMP D/c Lasix Restart HCTZ  GASTROINTESTINAL A:   GI Px is not indicated P:   Advance diet  D/c Protonix  HEMATOLOGIC A:   Anemia VTE Px P:  Trend CBC Heparin  INFECTIOUS A:   CAP P:   Abx as above  ENDOCRINE A:   No active issues P: No interventions required  NEUROLOGIC A:   No active issues P:   D/c Fentanyl / Versed    Deneise Lever, MD [facility] [place] [phone] After hours [phone]  04/12/2014, 10:40 AM

## 2014-04-12 NOTE — Plan of Care (Signed)
Problem: Phase II Progression Outcomes Goal: Pain controlled Outcome: Progressing Medicated for pain as ordered.

## 2014-04-13 ENCOUNTER — Inpatient Hospital Stay (HOSPITAL_COMMUNITY): Payer: Medicare Other

## 2014-04-13 DIAGNOSIS — R5381 Other malaise: Secondary | ICD-10-CM

## 2014-04-13 MED ORDER — ENOXAPARIN SODIUM 40 MG/0.4ML ~~LOC~~ SOLN
40.0000 mg | SUBCUTANEOUS | Status: DC
  Administered 2014-04-13 – 2014-04-14 (×2): 40 mg via SUBCUTANEOUS
  Filled 2014-04-13 (×2): qty 0.4

## 2014-04-13 NOTE — Progress Notes (Signed)
Pt sitting in recliner chair at bedside. Chair alarm in place. Skin warm and dry.transfers with assistance x 1. Denies pain and discomfort.

## 2014-04-13 NOTE — Consult Note (Signed)
Physical Medicine and Rehabilitation Consult Reason for Consult: Deconditioning/respiratory failure Referring Physician: Pulmonary critical care   HPI: Shirley Schneider is a 68 y.o. right handed female with history of asthma, hypertension and fibromyalgia. Patient lives with family and used a walker and wheelchair prior to admission. Admitted 04/04/2014 with increasing shortness of breath and productive cough. Found to be hypoxic in the low 80s on room air as well as hypotensive requiring pressors. Chest x-ray bilateral basilar predominant infiltrates right greater than left that likely represented pulmonary edema. Placed on broad-spectrum antibiotics. Patient required intubation and extubated 04/10/2014. Echocardiogram with ejection fraction of 65% no wall motion abnormalities. Subcutaneous heparin as per DVT prophylaxis. All antibiotics and since been discontinued. Latest chest x-ray with improved lung aeration when compared to prior study. Patient is tolerating a regular diet. Physical therapy evaluation followup completed 04/10/2014 with recommendations for physical medicine rehabilitation consult.   Review of Systems  Respiratory: Positive for shortness of breath.   Cardiovascular: Positive for leg swelling.  Musculoskeletal: Positive for myalgias.  Neurological: Positive for weakness.  All other systems reviewed and are negative.  Past Medical History  Diagnosis Date  . Fibromyalgia   . Asthma   . Hypertension    Past Surgical History  Procedure Laterality Date  . Hernia repair  1970   History reviewed. No pertinent family history. Social History:  reports that she has never smoked. She does not have any smokeless tobacco history on file. She reports that she does not drink alcohol or use illicit drugs. Allergies:  Allergies  Allergen Reactions  . Aspirin     upstomach   Medications Prior to Admission  Medication Sig Dispense Refill  . albuterol (PROVENTIL  HFA;VENTOLIN HFA) 108 (90 BASE) MCG/ACT inhaler Inhale 2 puffs into the lungs every 6 (six) hours as needed for wheezing.      Marland Kitchen atenolol (TENORMIN) 50 MG tablet Take 50 mg by mouth daily.      . budesonide-formoterol (SYMBICORT) 160-4.5 MCG/ACT inhaler Inhale 2 puffs into the lungs 2 (two) times daily.      . diclofenac sodium (VOLTAREN) 1 % GEL Apply 1 application topically 4 (four) times daily. Apply to knees      . hydrochlorothiazide (HYDRODIURIL) 25 MG tablet Take 25 mg by mouth daily.      Marland Kitchen HYDROcodone-acetaminophen (NORCO) 7.5-325 MG per tablet Take 1 tablet by mouth every 6 (six) hours as needed for pain.      Marland Kitchen tiZANidine (ZANAFLEX) 4 MG tablet Take 4 mg by mouth at bedtime.        Home: Home Living Family/patient expects to be discharged to:: Inpatient rehab Living Arrangements: Children  Functional History: Prior Function Comments: Pt unable to answer history questions at this time due to being on ventillator.  Functional Status:  Mobility: Bed Mobility Overal bed mobility: Needs Assistance Bed Mobility: Supine to Sit Rolling: Max assist Supine to sit: Min guard Sit to supine: Mod assist;+2 for physical assistance;+2 for safety/equipment General bed mobility comments: Pt able to transition to EOB with min guard assist, use of bed rails for support, and increased time to scoot hips to EOB.  Transfers Overall transfer level: Needs assistance Equipment used: Rolling walker (2 wheeled) Transfers: Sit to/from [facility] Sit to Stand: Mod assist;+2 physical assistance Stand pivot transfers: Min assist;+2 physical assistance;+2 safety/equipment General transfer comment: Pt able to power-up to full standing with +2 assist and increased time. Pt uses walker at home and asks  for RW for UE support. VC's for improved posture however unable to stand fully erect. Increased time to take a few side steps and pivot around to the chair.       ADL:     Cognition: Cognition Overall Cognitive Status: Within Functional Limits for tasks assessed Orientation Level: Oriented X4 Cognition Arousal/Alertness: Awake/alert Behavior During Therapy: Flat affect Overall Cognitive Status: Within Functional Limits for tasks assessed Difficult to assess due to: Intubated  Blood pressure 117/60, pulse 93, temperature 97.7 F (36.5 C), temperature source Oral, resp. rate 18, height 5\' 5"  (1.651 m), weight 101.288 kg (223 lb 4.8 oz), SpO2 97.00%. Physical Exam  HENT:  Head: Normocephalic.  Eyes: EOM are normal.  Neck: Normal range of motion. Neck supple. No thyromegaly present.  Cardiovascular: Normal rate and regular rhythm.   Respiratory: Effort normal and breath sounds normal. No respiratory distress.  GI: Soft. Bowel sounds are normal. She exhibits no distension.  Neurological:  Flat affect. She was oriented to person, place, date of birth. She follows simple commands. UE's 4/5 deltoid, bicep, HI. Tricep. RHF 3-, KE 3/5. Right ADF/APF 4/5. LLE grossly 4- to 4/5 prox to distal.   Skin: Skin is warm and dry.    No results found for this or any previous visit (from the past 24 hour(s)). Dg Chest 2 View  04/13/2014   CLINICAL DATA:  Short of breath.  Weakness.  EXAM: CHEST  2 VIEW  COMPARISON:  04/11/2014  FINDINGS: Since the prior study, there has been further improvement. Generalized interstitial thickening has improved. Mild patchy airspace opacity in the right mid and lower lung is less prominent. No new lung opacities. No pneumothorax.  Right internal jugular central venous line tip is stable, projecting in the right atrium.  Cardiac silhouette is mildly enlarged.  The aorta is tortuous.  IMPRESSION: Improved lung aeration when compared to the prior study.   Electronically Signed   By: Lajean Manes M.D.   On: 04/13/2014 07:52    Assessment/Plan: Diagnosis: deconditioning related to CHF/respiratory failure 1. Does the need for close, 24 hr/day  medical supervision in concert with the patient's rehab needs make it unreasonable for this patient to be served in a less intensive setting? Yes 2. Co-Morbidities requiring supervision/potential complications: htn,  3. Due to bladder management, bowel management, safety, skin/wound care, disease management, medication administration, pain management and patient education, does the patient require 24 hr/day rehab nursing? Yes 4. Does the patient require coordinated care of a physician, rehab nurse, PT (1-2 hrs/day, 5 days/week) and OT (1-2 hrs/day, 5 days/week) to address physical and functional deficits in the context of the above medical diagnosis(es)? Yes Addressing deficits in the following areas: balance, endurance, locomotion, strength, transferring, bowel/bladder control, bathing, dressing, feeding, grooming, toileting and psychosocial support 5. Can the patient actively participate in an intensive therapy program of at least 3 hrs of therapy per day at least 5 days per week? Yes 6. The potential for patient to make measurable gains while on inpatient rehab is excellent 7. Anticipated functional outcomes upon discharge from inpatient rehab are modified independent and supervision  with PT, modified independent and supervision with OT, n/a with SLP. 8. Estimated rehab length of stay to reach the above functional goals is: 7-10 days 9. Does the patient have adequate social supports to accommodate these discharge functional goals? Yes and Potentially 10. Anticipated D/C setting: Home 11. Anticipated post D/C treatments: [place] therapy 12. Overall Rehab/Functional Prognosis: excellent  RECOMMENDATIONS: This patient's condition  is appropriate for continued rehabilitative care in the following setting: CIR Patient has agreed to participate in recommended program. No and Potentially Note that insurance prior authorization may be required for reimbursement for recommended care.  Comment: Pt is hesitant  to come to inpatient rehab. She really wants to go home. I asked her to speak with her children who would be helping at discharge. I would be willing to bring her. Rehab Admissions Coordinator to follow up.  Thanks,  Meredith Staggers, MD, [facility]     04/13/2014

## 2014-04-13 NOTE — Progress Notes (Addendum)
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   SIGNIFICANT EVENTS / STUDIES:  5/31  TTE >>> EF 65%, no RWMA   6/1    Bronchoscopy, purulent secretion 6/4    Extubated\ 6/6    Transfer to floor  LINES / TUBES: R IJ CVL 5/30 >>> 6/6 ETT 5/30 >>> 6/5  CULTURES: 5/30  Blood >>> neg 5/31  Urine >>> neg 5/30  Sputum >>> neg 5/31  Respiratory viral panel >>> neg  ANTIBIOTICS: Rocephin 5/30 >>>6/1, 6/3 >>> Azithromycin 5/30 >>> 6/4 Zosyn 5/31 >>> 6/3 Vancomycin 5/31 >>> 6/2  INTERVAL HISTORY:  Comfortable in bed on 2L [place], C/o mild back pain which is greatly improved with the addition of hydrocodone 6/7. No other complaints.   VITAL SIGNS: Temp:  [97.7 F (36.5 C)-98.6 F (37 C)] 97.7 F (36.5 C) (06/08 0850) Pulse Rate:  [74-93] 93 (06/08 0850) Resp:  [18-20] 18 (06/08 0850) BP: (117-146)/(60-92) 117/60 mmHg (06/08 0850) SpO2:  [97 %-100 %] 97 % (06/08 0850) Weight:  [101.288 kg (223 lb 4.8 oz)] 101.288 kg (223 lb 4.8 oz) (06/08 0500) HEMODYNAMICS:   VENTILATOR SETTINGS:   INTAKE / OUTPUT: Intake/Output     06/07 0701 - 06/08 0700 06/08 0701 - 06/09 0700   P.O. 480 120   I.V. (mL/kg) 60 (0.6)    IV Piggyback     Total Intake(mL/kg) 540 (5.3) 120 (1.2)   Urine (mL/kg/hr) 1575 (0.6)    Stool     Total Output 1575     Net -1035 +120         PHYSICAL EXAMINATION: General:  No distress, obese, comfortable in bed on 2L [place] Neuro:  Awake, alert HEENT:  No JVD Cardiovascular:  RRR, no m/r/g Lungs:  Bilateral diminished air entry, few crackles in bases, O2 Abdomen:  Soft, nontender, bowel sounds diminished Musculoskeletal:  Moves all extremities, no edema Skin:   Intact  LABS: CBC  Recent Labs Lab 04/07/14 0430 04/09/14 0450 04/11/14 0400  WBC 12.2* 6.3 7.9  HGB 8.7* 8.4* 9.5*  HCT 26.5* 26.0* 30.6*  PLT 230 267 466*   Coag's No results found for this basename: APTT, INR,  in the last 168 hours BMET  Recent Labs Lab 04/09/14 0450 04/10/14 0500 04/11/14 0400  NA 142 141 144  K 3.6* 3.7 4.0  CL 102 102 102  CO2 26 28 27   BUN 36* 38* 29*  CREATININE 0.91 0.83 0.73  GLUCOSE 119* 96 89   Electrolytes  Recent Labs Lab 04/08/14 0420 04/09/14 0450 04/10/14 0500 04/11/14 0400  CALCIUM 8.6 8.9 9.0 9.5  MG 1.4* 2.6*  --   --   PHOS 3.0 3.6  --   --     Sepsis Markers No results found for this basename: LATICACIDVEN, PROCALCITON, O2SATVEN,  in the last 168 hours ABG  Recent Labs Lab 04/07/14 0326 04/09/14 1147  PHART 7.476* 7.414  PCO2ART 26.8* 43.5  PO2ART 94.9 60.0*   Liver Enzymes  Recent Labs Lab 04/07/14 0430  AST 15  ALT 5  ALKPHOS 108  BILITOT 0.6  ALBUMIN 2.4*   Cardiac Enzymes No results found for this basename: TROPONINI, PROBNP,  in the last 168 hours Glucose  Recent Labs Lab 04/09/14  1127 04/09/14 1724 04/09/14 2053 04/10/14 0024 04/10/14 0437 04/10/14 0852  GLUCAP 106* 100* 112* 98 94 116*   IMAGING:  Dg Chest 2 View  04/13/2014   CLINICAL DATA:  Short of breath.  Weakness.  EXAM: CHEST  2 VIEW  COMPARISON:  04/11/2014  FINDINGS: Since the prior study, there has been further improvement. Generalized interstitial thickening has improved. Mild patchy airspace opacity in the right mid and lower lung is less prominent. No new lung opacities. No pneumothorax.  Right internal jugular central venous line tip is stable, projecting in the right atrium.  Cardiac silhouette is mildly enlarged.  The aorta is tortuous.  IMPRESSION: Improved lung aeration when compared to the prior study.   Electronically Signed   By: Lajean Manes M.D.   On: 04/13/2014 07:52   ASSESSMENT / PLAN:  PULMONARY A: Acute  respiratory failure - improving CAP H/o of Eosinophilic Pneumonia (peripheral eosinophils normal this admission and BAL not consistent with this Dx ) H/o Asthma, no acute bronchospasm P:  Wean supplemental oxygen to keep SpO2 > 92% Albuterol PRN Home Symbicort CIR consulted per PT recommendation vs re-consult PT for additional recs now that she is extubated?   Rocephin for 10 total ABX days, last day 6/9  CARDIOVASCULAR A:  Shock, resolved NSTEMI vs demands ischemia resolved H/o HTN P:  Hold home Atenolol for now D/c CVL  RENAL A:   AKI, resolved Negative stay balance ( - 3.8 L ) P:   Trend BMP D/c Lasix Home HCTZ  HEMATOLOGIC A:   Anemia  Appears chronic.  VTE Px P:  Trend CBC Enoxaparin   Probable d/c next 24-48 hours. CIR consulted per PT rec's. PT lives with daughter who is able to help her at home. Wean O2 as tolerated.   Georgann Housekeeper, ACNP [facility] Pager [phone] or [phone]  PCCM ATTENDING: I have interviewed and examined the patient and reviewed the database. I have formulated the assessment and plan as reflected in the note above with amendments made by me.   CIR consult. She is ready for transfer to Rehab any time  Merton Border, MD;  PCCM service; Mobile [phone]

## 2014-04-13 NOTE — Progress Notes (Signed)
I agree with 04/11/14 assessment of V. Brown 7a-7p

## 2014-04-13 NOTE — Progress Notes (Signed)
Physical Therapy Treatment Patient Details Name: Shirley Schneider MRN: [id number] DOB: [birth year] Today's Date: 04/13/2014    History of Present Illness Pt is a 68 y/o female admitted with right sided chest pain, SOB, pink frothy sputum. She is currently being managed for PNA, VDRF - extubated 04/10/14.    PT Comments    Pt progressing towards physical therapy goals. Tolerance for functional activity is low, and pt with DOE and required a seated rest break after ambulating ~15 feet. Throughout session, O2 sats remain >92% on RA. Will continue to follow and progress as able.   Follow Up Recommendations  CIR;Supervision/Assistance - 24 hour     Equipment Recommendations  None recommended by PT    Recommendations for Other Services Rehab consult     Precautions / Restrictions Precautions Precautions: Fall Restrictions Weight Bearing Restrictions: No    Mobility  Bed Mobility Overal bed mobility: Needs Assistance Bed Mobility: Supine to Sit     Supine to sit: Supervision     General bed mobility comments: Pt able to transition to EOB without physical assist. Use of bed rails and increased time required - supervision for safety.   Transfers Overall transfer level: Needs assistance Equipment used: Rolling walker (2 wheeled) Transfers: Sit to/from Stand Sit to Stand: Min assist         General transfer comment: VC's for hand placement on seated surface for safety, as well as for improved posture with the RW.   Ambulation/Gait Ambulation/Gait assistance: Min guard Ambulation Distance (Feet): 25 Feet Assistive device: Rolling walker (2 wheeled) Gait Pattern/deviations: Step-through pattern;Decreased stride length;Trunk flexed Gait velocity: Decreased Gait velocity interpretation: Below normal speed for age/gender General Gait Details: Pt fatigues very quickly. 1 seated rest break required. Pt on RA throughout session and O2 sats remained >92% throughout gait training.     Stairs            Wheelchair Mobility    Modified Rankin (Stroke Patients Only)       Balance Overall balance assessment: Needs assistance Sitting-balance support: Feet supported;Bilateral upper extremity supported Sitting balance-Leahy Scale: Fair     Standing balance support: Bilateral upper extremity supported Standing balance-Leahy Scale: Poor                      Cognition Arousal/Alertness: Awake/alert Behavior During Therapy: Flat affect Overall Cognitive Status: Within Functional Limits for tasks assessed                      Exercises      General Comments General comments (skin integrity, edema, etc.): Discussed LE HEP for strengthening. Pt did a few reps each of quad sets, ankle pumps, hip abd/add.      Pertinent Vitals/Pain See above for vitals.     Home Living                      Prior Function            PT Goals (current goals can now be found in the care plan section) Acute Rehab PT Goals Patient Stated Goal: Not stated PT Goal Formulation: With patient Time For Goal Achievement: 04/22/14 Potential to Achieve Goals: Good Progress towards PT goals: Progressing toward goals    Frequency  Min 3X/week    PT Plan Current plan remains appropriate    Co-evaluation             End of Session Equipment Utilized During  Treatment: Gait belt Activity Tolerance: Patient limited by fatigue Patient left: in chair;with chair alarm set;with call bell/phone within reach     Time: 1426-1446 PT Time Calculation (min): 20 min  Charges:  $Gait Training: 8-22 mins                    G Codes:      Jolyn Lent 04-27-14, 4:47 PM  Jolyn Lent, PT, DPT Acute Rehabilitation Services Pager: [phone]

## 2014-04-13 NOTE — Progress Notes (Signed)
Removed O2 via [place]. Checked sats 20 minutes later. Sats 98% on room air.

## 2014-04-13 NOTE — Progress Notes (Signed)
I will follow up with pt and family tomorrow to discuss a possible inpt rehab admission or if she still wants to d/c home with [facility] when medically ready. [phone]

## 2014-04-14 MED ORDER — ALBUTEROL SULFATE HFA 108 (90 BASE) MCG/ACT IN AERS: 2.0000 | INHALATION_SPRAY | Freq: Four times a day (QID) | RESPIRATORY_TRACT | Status: DC | PRN

## 2014-04-14 NOTE — Discharge Summary (Signed)
Physician Discharge Summary  Patient ID: TAHLOR BERENGUER MRN: [id number] DOB/AGE: 67-Dec-1947 67 y.o.   Admit date: 04/04/2014 Discharge date: 04/14/2014    Discharge Diagnoses:  Acute Respiratory Failure CAP Hx of Eosinophilic PNA Asthma Shock  NSTEMI HTN Acute Kidney Injury Anemia Deconditioning                                                                         DISCHARGE PLAN BY DIAGNOSIS     Acute Respiratory Failure CAP Hx of Eosinophilic PNA Asthma  Discharge Plan: -Follow up with Dr. Chase Caller  -Continue Symbicort -PRN albuterol -Did not qualify for oxygen on ambulatory assessment prior to discharge  Shock  NSTEMI HTN  Discharge Plan: -Continue HCTZ, atenolol   Acute Kidney Injury  Discharge Plan: -Follow up BMP PRN  Anemia  Discharge Plan: -Follow up CBC PRN  Deconditioning  Discharge Plan: -Arranged for home PT, RN -Has home walker, wheelchair, bedside commode & shower chair -Pt refused CIR placement                DISCHARGE SUMMARY   Shirley Schneider is a 68 y.o. y/o female, never smoker, with a PMH of HTN, Chronic Pain / Fibromyalgia, Asthma, & Eosinophilic PNA who presented to [facility] on 04/04/14 with complaints of increasing shortness of breath.  Patient reported that she had been progressively ill for one week prior to presentation with weakness, dizziness, questionable subjective fevers (not checked) cough & white sputum production that progressed to pink frothy sputum.  Initial evaluation noted saturations in the mid 80's on RA, crackles on exam and hypotension.  Patient was treated with 100% NRB & NS Bolus.  Unfortunately, she did not respond to volume resuscitation and required vasopressor support to maintain normal MAP.  CXR noted bilateral basilar infiltrates thought likely to be pulmonary edema. Blood work revealed a normal WBC, no peripheral eosinophilia, normal hgb, lactate of 2.6 and elevated creatinine to 2.28  (baseline 0.68 2012), BNP of 6040. ABG showed 7.24 / 46 / 59 / 20 / 85%.  Patient was admitted to ICU and placed on BiPAP for respiratory support.  She was empirically treated for CAP with rocephin & azithromycin.  She had progressive decline (hypercarbia) in respiratory status requiring intubation in the early am of 5/31.  She underwent bronchoscopy on 6/1 with purulent secretions noted.  BAL was positive for a few yeast but otherwise negative.   Cultures negative to date including respiratory viral panel.  Hypotension was thought related to septic shock (CoOx 67 / cardiogenic less likely).  Noted troponin leak consistent with NSTEMI type II.  ECHO was assessed which noted an EF of 65% and no RWMA.  Patient was weaned off vasopressors and then diuresed for negative balance with improvement in status.  She was able to be extubated on 6/4 without further respiratory complications.  Patient was evaluated by Physical Therapy and recommended for inpatient rehab but she refused placement.  She was assessed for oxygen needs prior to discharge and did not qualify for home oxygen.  See discharge plans as above.              SIGNIFICANT EVENTS / STUDIES:  5/31 - TTE >>> EF 65%, no  RWMA  6/01 - Bronchoscopy, purulent secretion  6/04 - Extubated 6/06 - Transfer to floor   LINES / TUBES:  R IJ CVL 5/30 >>> 6/6  ETT 5/30 >>> 6/5   CULTURES:  5/30 Blood >>> neg  5/31 Urine >>> neg  5/30 Sputum >>> neg  5/31 Respiratory viral panel >>> neg  6/01 BAL (bronch) 6/1 >>> few yeast c/w candida, cell diff c/w pus, no eos  ANTIBIOTICS:  Rocephin 5/30 >>>6/1, 6/3 >>>6/9 Azithromycin 5/30 >>> 6/4  Zosyn 5/31 >>> 6/3  Vancomycin 5/31 >>> 6/2    Discharge Exam: General: No distress, obese, lying in bed Neuro: Awake, alert  HEENT: No JVD  Cardiovascular: RRR, no m/r/g  Lungs: even/non-labored, bilateral diminished air entry Abdomen: Soft, nontender, bowel sounds diminished  Musculoskeletal: Moves all  extremities, no edema  Skin: Intact   Filed Vitals:   04/13/14 2050 04/13/14 2100 04/14/14 0603 04/14/14 1044  BP:  137/89 130/72 137/88  Pulse:  81 77 88  Temp:  98.7 F (37.1 C) 98.3 F (36.8 C)   TempSrc:  Oral Oral   Resp:  $Remo'20 18 18  'mvthj$ Height:      Weight:   220 lb 7.4 oz (100 kg)   SpO2: 99% 93% 97% 95%     Discharge Labs  BMET  Recent Labs Lab 04/08/14 0420 04/09/14 0450 04/10/14 0500 04/11/14 0400  NA 143 142 141 144  K 3.0* 3.6* 3.7 4.0  CL 104 102 102 102  CO2 $Re'25 26 28 27  'osL$ GLUCOSE 113* 119* 96 89  BUN 28* 36* 38* 29*  CREATININE 1.07 0.91 0.83 0.73  CALCIUM 8.6 8.9 9.0 9.5  MG 1.4* 2.6*  --   --   PHOS 3.0 3.6  --   --    CBC  Recent Labs Lab 04/09/14 0450 04/11/14 0400  HGB 8.4* 9.5*  HCT 26.0* 30.6*  WBC 6.3 7.9  PLT 267 466*      Medication List         albuterol 108 (90 BASE) MCG/ACT inhaler  Commonly known as:  PROVENTIL HFA;VENTOLIN HFA  Inhale 2 puffs into the lungs every 6 (six) hours as needed for wheezing.     atenolol 50 MG tablet  Commonly known as:  TENORMIN  Take 50 mg by mouth daily.     budesonide-formoterol 160-4.5 MCG/ACT inhaler  Commonly known as:  SYMBICORT  Inhale 2 puffs into the lungs 2 (two) times daily.     diclofenac sodium 1 % Gel  Commonly known as:  VOLTAREN  Apply 1 application topically 4 (four) times daily. Apply to knees     hydrochlorothiazide 25 MG tablet  Commonly known as:  HYDRODIURIL  Take 25 mg by mouth daily.     HYDROcodone-acetaminophen 7.5-325 MG per tablet  Commonly known as:  NORCO  Take 1 tablet by mouth every 6 (six) hours as needed for pain.     tiZANidine 4 MG tablet  Commonly known as:  ZANAFLEX  Take 4 mg by mouth at bedtime.       Follow-up Information   Follow up with Philis Fendt, MD On 04/17/2014. (Appt at 9:00 AM - Keep your appointment!)    Specialty:  Internal Medicine   Contact information:   [place] [address] [phone]        Follow up with [facility], MD On 04/27/2014. (Appt at 3:00 PM)    Specialty:  Pulmonary Disease   Contact information:   [address]  [place] [address] [phone]        Disposition:  Home with home health PT  Discharged Condition: KATRINA DADDONA has met maximum benefit of inpatient care and is medically stable and cleared for discharge.  Patient is pending follow up as above.      Time spent on disposition:  Greater than 35 minutes.   Signed: Noe Gens, NP-C [facility] Pgr: [phone] Office: [phone]    PCCM ATTENDING: I have interviewed and examined the patient and reviewed the database.  Agree with above  Merton Border, MD ; [facility] [phone].  After 5:30 PM or weekends, call [phone]

## 2014-04-14 NOTE — Progress Notes (Signed)
No acute events happened overnight.

## 2014-04-27 ENCOUNTER — Ambulatory Visit (INDEPENDENT_AMBULATORY_CARE_PROVIDER_SITE_OTHER): Payer: Medicare Other | Admitting: Internal Medicine

## 2014-04-27 ENCOUNTER — Encounter: Payer: Self-pay | Admitting: Internal Medicine

## 2014-04-27 VITALS — BP 146/98 | HR 53 | Ht 65.0 in

## 2014-04-27 DIAGNOSIS — J9620 Acute and chronic respiratory failure, unspecified whether with hypoxia or hypercapnia: Secondary | ICD-10-CM

## 2014-04-27 DIAGNOSIS — R0902 Hypoxemia: Secondary | ICD-10-CM

## 2014-04-27 DIAGNOSIS — J9621 Acute and chronic respiratory failure with hypoxia: Secondary | ICD-10-CM

## 2014-04-27 DIAGNOSIS — J189 Pneumonia, unspecified organism: Secondary | ICD-10-CM

## 2014-04-27 NOTE — Progress Notes (Signed)
Subjective:    Patient ID: Shirley Schneider, female    DOB: 1946/07/11, 68 y.o.   MRN: [id number]  HPI\ Admit date: 04/04/2014  Discharge date: 04/14/2014  Discharge Diagnoses:  Acute Respiratory Failure  CAP  Hx of Eosinophilic PNA  Asthma  Shock  NSTEMI  HTN  Acute Kidney Injury  Anemia  Deconditioning     68 y.o. y/o female, never smoker, with a PMH of HTN, Chronic Pain / Fibromyalgia, Asthma, & Eosinophilic PNA who presented to [facility] on 04/04/14 with complaints of increasing shortness of breath. Patient reported that she had been progressively ill for one week prior to presentation with weakness, dizziness, questionable subjective fevers (not checked) cough & white sputum production that progressed to pink frothy sputum. Initial evaluation noted saturations in the mid 80's on RA, crackles on exam and hypotension. Patient was treated with 100% NRB & NS Bolus. Unfortunately, she did not respond to volume resuscitation and required vasopressor support to maintain normal MAP. CXR noted bilateral basilar infiltrates thought likely to be pulmonary edema. Blood work revealed a normal WBC, no peripheral eosinophilia, normal hgb, lactate of 2.6 and elevated creatinine to 2.28 (baseline 0.68 2012), BNP of 6040. ABG showed 7.24 / 46 / 59 / 20 / 85%. Patient was admitted to ICU and placed on BiPAP for respiratory support. She was empirically treated for CAP with rocephin & azithromycin. She had progressive decline (hypercarbia) in respiratory status requiring intubation in the early am of 5/31. She underwent bronchoscopy on 6/1 with purulent secretions noted. BAL was positive for a few yeast but otherwise negative. Cultures negative to date including respiratory viral panel. Hypotension was thought related to septic shock (CoOx 67 / cardiogenic less likely). Noted troponin leak consistent with NSTEMI type II. ECHO was assessed which noted an EF of 65% and no RWMA. Patient was weaned off  vasopressors and then diuresed for negative balance with improvement in status. She was able to be extubated on 6/4 without further respiratory complications. Patient was evaluated by Physical Therapy and recommended for inpatient rehab but she refused placement. She was assessed for oxygen needs prior to discharge and did not qualify for home oxygen. See discharge plans as above.  SIGNIFICANT EVENTS / STUDIES:  5/31- CT chestIMPRESSION:  1. Bilateral lower lobe pneumonia, extensive on the right.  2. Consider aspiration as the inciting cause given the lower lobe  pattern, hiatal hernia, and high-density debris in the lower lobes.  Electronically Signed  By: Jorje Guild M.D.  On: 04/06/2014 01:38   5/31 - TTE >>> EF 65%, no RWMA  6/01 - Bronchoscopy, purulent secretion  6/04 - Extubated  6/06 - Transfer to floor  LINES / TUBES:  R IJ CVL 5/30 >>> 6/6  ETT 5/30 >>> 6/5  CULTURES:  5/30 Blood >>> neg  5/31 Urine >>> neg  5/30 Sputum >>> neg  5/31 Respiratory viral panel >>> neg  6/01 BAL (bronch) 6/1 >>> few yeast c/w candida, cell diff c/w pus, no eos  ANTIBIOTICS:  Rocephin 5/30 >>>6/1, 6/3 >>>6/9  Azithromycin 5/30 >>> 6/4  Zosyn 5/31 >>> 6/3  Vancomycin 5/31 >>> 6/2    Medication List         albuterol 108 (90 BASE) MCG/ACT inhaler    Commonly known as: PROVENTIL HFA;VENTOLIN HFA    Inhale 2 puffs into the lungs every 6 (six) hours as needed for wheezing.    atenolol 50 MG tablet    Commonly known as: TENORMIN  Take 50 mg by mouth daily.    budesonide-formoterol 160-4.5 MCG/ACT inhaler    Commonly known as: SYMBICORT    Inhale 2 puffs into the lungs 2 (two) times daily.    diclofenac sodium 1 % Gel    Commonly known as: VOLTAREN    Apply 1 application topically 4 (four) times daily. Apply to knees    hydrochlorothiazide 25 MG tablet    Commonly known as: HYDRODIURIL    Take 25 mg by mouth daily.    HYDROcodone-acetaminophen 7.5-325 MG per tablet    Commonly  known as: NORCO    Take 1 tablet by mouth every 6 (six) hours as needed for pain.    tiZANidine 4 MG tablet    Commonly known as: ZANAFLEX    Take 4 mg by mouth at bedtime.       OV 04/27/2014  Chief Complaint  Patient presents with  . Follow-up    HFU per Brandi for pna. Pt c/o mild dry cough. Pt denies SOB and CP.    - Recent admission in the hospital for bilateral pneumonia not otherwise specified. It was not felt to be eos specific pneumonia exacerbation but instead CAP NOS She was intubated and extubated. She was discharged home on Symbicort. She was not advised oxygen. Since discharge she has been doing well and quite functional at home. Currently she is sitting on a wheelchair. But she denies any deconditioning effects. She does not think she needs home physical therapy. She uses on a Symbicort as needed though is advised to take it regularly. She has no other complaints.  Review of Systems  Constitutional: Negative for fever and unexpected weight change.  HENT: Positive for sinus pressure. Negative for congestion, dental problem, ear pain, nosebleeds, postnasal drip, rhinorrhea, sneezing, sore throat and trouble swallowing.   Eyes: Negative for redness and itching.  Respiratory: Positive for cough. Negative for chest tightness, shortness of breath and wheezing.   Cardiovascular: Negative for palpitations and leg swelling.  Gastrointestinal: Negative for nausea and vomiting.  Genitourinary: Negative for dysuria.  Musculoskeletal: Positive for joint swelling.  Skin: Negative for rash.  Neurological: Negative for headaches.  Hematological: Does not bruise/bleed easily.  Psychiatric/Behavioral: Negative for dysphoric mood. The patient is not nervous/anxious.        Objective:   Physical Exam  Vitals reviewed. Constitutional: She is oriented to person, place, and time. She appears well-developed and well-nourished. No distress.  Deconditioned looking - but denies  Sitting  In  wheel chair -says she is functional at home Obese   HENT:  Head: Normocephalic and atraumatic.  Right Ear: External ear normal.  Left Ear: External ear normal.  Mouth/Throat: Oropharynx is clear and moist. No oropharyngeal exudate.  Eyes: Conjunctivae and EOM are normal. Pupils are equal, round, and reactive to light. Right eye exhibits no discharge. Left eye exhibits no discharge. No scleral icterus.  Neck: Normal range of motion. Neck supple. No JVD present. No tracheal deviation present. No thyromegaly present.  Cardiovascular: Normal rate, regular rhythm, normal heart sounds and intact distal pulses.  Exam reveals no gallop and no friction rub.   No murmur heard. Pulmonary/Chest: Effort normal and breath sounds normal. No respiratory distress. She has no wheezes. She has no rales. She exhibits no tenderness.  Abdominal: Soft. Bowel sounds are normal. She exhibits no distension and no mass. There is no tenderness. There is no rebound and no guarding.  Musculoskeletal: Normal range of motion. She exhibits no edema and no  tenderness.  Lymphadenopathy:    She has no cervical adenopathy.  Neurological: She is alert and oriented to person, place, and time. She has normal reflexes. No cranial nerve deficit. She exhibits normal muscle tone. Coordination normal.  Skin: Skin is warm and dry. No rash noted. She is not diaphoretic. No erythema. No pallor.  Psychiatric: She has a normal mood and affect. Her behavior is normal. Judgment and thought content normal.    Filed Vitals:   04/27/14 1516  BP: 146/98  Pulse: 53  Height: 5\' 5"  (1.651 m)  SpO2: 95%         Assessment & Plan:  Glad you are doing better Please take symbicort daily (not as needed) REturn end August 2015 with CT chest done just prior to followup   Followup  End aug 2015 after CT chest PFT to be decided at followup

## 2014-04-27 NOTE — Patient Instructions (Signed)
Glad you are doing better Please take symbicort daily (not as needed) REturn end August 2015 with CT chest done just prior to followup   Followup  End aug 2015 after CT chest

## 2014-05-03 NOTE — Assessment & Plan Note (Signed)
Was intubated on basis of pna.CAP . Once I ensure CT aug 2015 shows clearance, wil get PFT

## 2014-05-03 NOTE — Assessment & Plan Note (Signed)
  Clinically better. Will do ct chest end aug 2015 to ensure clearance

## 2014-05-21 ENCOUNTER — Other Ambulatory Visit: Payer: Self-pay | Admitting: Internal Medicine

## 2014-05-21 DIAGNOSIS — Z1231 Encounter for screening mammogram for malignant neoplasm of breast: Secondary | ICD-10-CM

## 2014-06-19 ENCOUNTER — Other Ambulatory Visit: Payer: Self-pay | Admitting: Internal Medicine

## 2014-06-19 DIAGNOSIS — E2839 Other primary ovarian failure: Secondary | ICD-10-CM

## 2014-07-01 ENCOUNTER — Ambulatory Visit (INDEPENDENT_AMBULATORY_CARE_PROVIDER_SITE_OTHER): Payer: Medicare Other | Admitting: Internal Medicine

## 2014-07-01 ENCOUNTER — Encounter: Payer: Self-pay | Admitting: Internal Medicine

## 2014-07-01 ENCOUNTER — Ambulatory Visit (INDEPENDENT_AMBULATORY_CARE_PROVIDER_SITE_OTHER)
Admission: RE | Admit: 2014-07-01 | Discharge: 2014-07-01 | Disposition: A | Discharge status: Home / Self Care | Payer: Medicare Other | Source: Ambulatory Visit | Attending: Internal Medicine | Admitting: Internal Medicine

## 2014-07-01 VITALS — BP 152/98 | HR 62 | Ht 65.0 in | Wt 234.0 lb

## 2014-07-01 DIAGNOSIS — J189 Pneumonia, unspecified organism: Secondary | ICD-10-CM

## 2014-07-01 DIAGNOSIS — R0902 Hypoxemia: Secondary | ICD-10-CM

## 2014-07-01 DIAGNOSIS — J9601 Acute respiratory failure with hypoxia: Secondary | ICD-10-CM

## 2014-07-01 DIAGNOSIS — J9600 Acute respiratory failure, unspecified whether with hypoxia or hypercapnia: Secondary | ICD-10-CM

## 2014-07-01 DIAGNOSIS — K449 Diaphragmatic hernia without obstruction or gangrene: Secondary | ICD-10-CM | POA: Insufficient documentation

## 2014-07-01 DIAGNOSIS — J9620 Acute and chronic respiratory failure, unspecified whether with hypoxia or hypercapnia: Secondary | ICD-10-CM

## 2014-07-01 DIAGNOSIS — J9621 Acute and chronic respiratory failure with hypoxia: Secondary | ICD-10-CM

## 2014-07-01 NOTE — Patient Instructions (Addendum)
#  Acute respiratory failure due to pneumonia  - in retrospect could have been due to reflux from hiatal hernia  - resolved on CT chest 07/01/2014 - return for clinical followup in 9 months - advised flu shot but respect your desire to hold off  #Hiatal Hernia  -please re-establish with Dr Oretha Caprice or [facility] who you last saw in 2012  #followup  9 months or sooner if needed

## 2014-07-01 NOTE — Progress Notes (Signed)
Subjective:    Patient ID: Shirley Schneider, female    DOB: [birth year], 68 y.o.   MRN: [id number]  HPI   Admit date: 04/04/2014  Discharge date: 04/14/2014  Discharge Diagnoses:  Acute Respiratory Failure  CAP  Hx of Eosinophilic PNA  Asthma  Shock  NSTEMI  HTN  Acute Kidney Injury  Anemia  Deconditioning     68 y.o. y/o female, never smoker, with a PMH of HTN, Chronic Pain / Fibromyalgia, Asthma, & Eosinophilic PNA who presented to [facility] on 04/04/14 with complaints of increasing shortness of breath. Patient reported that she had been progressively ill for one week prior to presentation with weakness, dizziness, questionable subjective fevers (not checked) cough & white sputum production that progressed to pink frothy sputum. Initial evaluation noted saturations in the mid 80's on RA, crackles on exam and hypotension. Patient was treated with 100% NRB & NS Bolus. Unfortunately, she did not respond to volume resuscitation and required vasopressor support to maintain normal MAP. CXR noted bilateral basilar infiltrates thought likely to be pulmonary edema. Blood work revealed a normal WBC, no peripheral eosinophilia, normal hgb, lactate of 2.6 and elevated creatinine to 2.28 (baseline 0.68 2012), BNP of 6040. ABG showed 7.24 / 46 / 59 / 20 / 85%. Patient was admitted to ICU and placed on BiPAP for respiratory support. She was empirically treated for CAP with rocephin & azithromycin. She had progressive decline (hypercarbia) in respiratory status requiring intubation in the early am of 5/31. She underwent bronchoscopy on 6/1 with purulent secretions noted. BAL was positive for a few yeast but otherwise negative. Cultures negative to date including respiratory viral panel. Hypotension was thought related to septic shock (CoOx 67 / cardiogenic less likely). Noted troponin leak consistent with NSTEMI type II. ECHO was assessed which noted an EF of 65% and no RWMA. Patient was weaned off  vasopressors and then diuresed for negative balance with improvement in status. She was able to be extubated on 6/4 without further respiratory complications. Patient was evaluated by Physical Therapy and recommended for inpatient rehab but she refused placement. She was assessed for oxygen needs prior to discharge and did not qualify for home oxygen. See discharge plans as above.  SIGNIFICANT EVENTS / STUDIES:  5/31- CT chestIMPRESSION:  1. Bilateral lower lobe pneumonia, extensive on the right.  2. Consider aspiration as the inciting cause given the lower lobe  pattern, hiatal hernia, and high-density debris in the lower lobes.  Electronically Signed  By: Jorje Guild M.D.  On: 04/06/2014 01:38   5/31 - TTE >>> EF 65%, no RWMA  6/01 - Bronchoscopy, purulent secretion  6/04 - Extubated  6/06 - Transfer to floor  LINES / TUBES:  R IJ CVL 5/30 >>> 6/6  ETT 5/30 >>> 6/5  CULTURES:  5/30 Blood >>> neg  5/31 Urine >>> neg  5/30 Sputum >>> neg  5/31 Respiratory viral panel >>> neg  6/01 BAL (bronch) 6/1 >>> few yeast c/w candida, cell diff c/w pus, no eos  ANTIBIOTICS:  Rocephin 5/30 >>>6/1, 6/3 >>>6/9  Azithromycin 5/30 >>> 6/4  Zosyn 5/31 >>> 6/3  Vancomycin 5/31 >>> 6/2    Medication List         albuterol 108 (90 BASE) MCG/ACT inhaler    Commonly known as: PROVENTIL HFA;VENTOLIN HFA    Inhale 2 puffs into the lungs every 6 (six) hours as needed for wheezing.    atenolol 50 MG tablet    Commonly known as:  TENORMIN    Take 50 mg by mouth daily.    budesonide-formoterol 160-4.5 MCG/ACT inhaler    Commonly known as: SYMBICORT    Inhale 2 puffs into the lungs 2 (two) times daily.    diclofenac sodium 1 % Gel    Commonly known as: VOLTAREN    Apply 1 application topically 4 (four) times daily. Apply to knees    hydrochlorothiazide 25 MG tablet    Commonly known as: HYDRODIURIL    Take 25 mg by mouth daily.    HYDROcodone-acetaminophen 7.5-325 MG per tablet    Commonly  known as: NORCO    Take 1 tablet by mouth every 6 (six) hours as needed for pain.    tiZANidine 4 MG tablet    Commonly known as: ZANAFLEX    Take 4 mg by mouth at bedtime.       OV 04/27/2014  Chief Complaint  Patient presents with  . Follow-up    HFU per Brandi for pna. Pt c/o mild dry cough. Pt denies SOB and CP.    - Recent admission in the hospital for bilateral pneumonia not otherwise specified. It was not felt to be eos specific pneumonia exacerbation but instead CAP NOS She was intubated and extubated. She was discharged home on Symbicort. She was not advised oxygen. Since discharge she has been doing well and quite functional at home. Currently she is sitting on a wheelchair. But she denies any deconditioning effects. She does not think she needs home physical therapy. She uses on a Symbicort as needed though is advised to take it regularly. She has no other complaints.  Glad you are doing better Please take symbicort daily (not as needed) REturn end August 2015 with CT chest done just prior to followup   Followup  End aug 2015 after CT chest  OV 07/01/2014  Chief Complaint  Patient presents with  . Follow-up    Pt here to review CT. Pt c/o dry cough x 86month. Pt denies SOB and CP.      Followup acute respiratory failure secondary to community acquired pneumonia not otherwise specified. Hx of suspected eosinophilic pnea n past  Currently she is doing well. Not sure she's taking a Symbicort. Though it is listed on the medication list. She denies any respiratory problems. She sitting on a wheelchair but this is due to arthritis of her knees. Check CT scan of the chest today that shows complete resolution of bilateral pneumonia. She has evidence of chronic long-standing moderate size hiatal hernia. She admits to active acid reflux symptoms for which he takes Prilosec. Again compliance is not known. She has a history of hiatal hernia repair in the 1970s. Last seen by  gastroenterologist several years ago according to her history that 2 years ago according to the current chart review. Details of 2012 GI evaluation is unknown. Overall she's well. I recommended flu shot but she is not too keen on taking it.  Ct Chest Wo Contrast  07/01/2014   CLINICAL DATA:  Chronic respiratory failure with hypoxia. Community acquired pneumonia. Follow-up.  EXAM: CT CHEST WITHOUT CONTRAST  TECHNIQUE: Multidetector CT imaging of the chest was performed following the standard protocol without IV contrast.  COMPARISON:  04/05/2014  FINDINGS: Previously seen consolidation in the lower lobes and right middle lobe has resolved. Minimal residual linear and ground-glass densities in the lower lobes and right middle lobe, likely residual scarring. Scarring noted in the right upper lobe, stable. No pleural effusions.  Stable moderate-sized  hiatal hernia. Heart is normal size. Aorta is normal caliber.  No mediastinal, hilar, or axillary adenopathy. Chest wall soft tissues are unremarkable. Imaging into the upper abdomen shows no acute findings.  No acute bony abnormality or focal bone lesion.  IMPRESSION: Resolution of previously seen bilateral multi focal airspace opacities. Minimal residual densities in these areas likely reflect residual scarring.  Stable moderate-sized hiatal hernia.   Electronically Signed   By: Rolm Baptise M.D.   On: 07/01/2014 12:38      has a past medical history of Fibromyalgia; Asthma; and Hypertension.   has past surgical history that includes Hernia repair (1970).  Current outpatient prescriptions:albuterol (PROVENTIL HFA;VENTOLIN HFA) 108 (90 BASE) MCG/ACT inhaler, Inhale 2 puffs into the lungs every 6 (six) hours as needed for wheezing., Disp: 1 Inhaler, Rfl: 3;  atenolol (TENORMIN) 50 MG tablet, Take 50 mg by mouth daily., Disp: , Rfl: ;  budesonide-formoterol (SYMBICORT) 160-4.5 MCG/ACT inhaler, Inhale 2 puffs into the lungs 2 (two) times daily., Disp: , Rfl:    diclofenac sodium (VOLTAREN) 1 % GEL, Apply 1 application topically 4 (four) times daily. Apply to knees, Disp: , Rfl: ;  HYDROcodone-acetaminophen (NORCO) 7.5-325 MG per tablet, Take 1 tablet by mouth every 6 (six) hours as needed for pain., Disp: , Rfl: ;  tiZANidine (ZANAFLEX) 4 MG tablet, Take 4 mg by mouth at bedtime., Disp: , Rfl: ;  hydrochlorothiazide (HYDRODIURIL) 25 MG tablet, Take 25 mg by mouth daily., Disp: , Rfl:    Review of Systems  Constitutional: Negative for fever and unexpected weight change.  HENT: Negative for congestion, dental problem, ear pain, nosebleeds, postnasal drip, rhinorrhea, sinus pressure, sneezing, sore throat and trouble swallowing.   Eyes: Negative for redness and itching.  Respiratory: Positive for cough. Negative for chest tightness, shortness of breath and wheezing.   Cardiovascular: Negative for palpitations and leg swelling.  Gastrointestinal: Negative for nausea and vomiting.  Genitourinary: Negative for dysuria.  Musculoskeletal: Negative for joint swelling.  Skin: Negative for rash.  Neurological: Negative for headaches.  Hematological: Does not bruise/bleed easily.  Psychiatric/Behavioral: Negative for dysphoric mood. The patient is not nervous/anxious.        Objective:   Physical Exam  Filed Vitals:   07/01/14 1414  BP: 152/98  Pulse: 62  Height: 5\' 5"  (1.651 m)  Weight: 234 lb (106.142 kg)  SpO2: 95%   tals reviewed. Constitutional: She is oriented to person, place, and time. She appears well-developed and well-nourished. No distress.  Deconditioned looking - but denies  Sitting  In wheel chair -says she is functional at home Obese,, Foul smell of her wet urinary pad in office   HENT:  Head: Normocephalic and atraumatic.  Right Ear: External ear normal.  Left Ear: External ear normal.  Mouth/Throat: Oropharynx is clear and moist. No oropharyngeal exudate.  Eyes: Conjunctivae and EOM are normal. Pupils are equal, round, and  reactive to light. Right eye exhibits no discharge. Left eye exhibits no discharge. No scleral icterus.  Neck: Normal range of motion. Neck supple. No JVD present. No tracheal deviation present. No thyromegaly present.  Cardiovascular: Normal rate, regular rhythm, normal heart sounds and intact distal pulses.  Exam reveals no gallop and no friction rub.   No murmur heard. Pulmonary/Chest: Effort normal and breath sounds normal. No respiratory distress. She has no wheezes. She has no rales. She exhibits no tenderness.  Abdominal: Soft. Bowel sounds are normal. She exhibits no distension and no mass. There is no tenderness. There  is no rebound and no guarding.  Musculoskeletal: Normal range of motion. She exhibits no edema and no tenderness.  Lymphadenopathy:    She has no cervical adenopathy.  Neurological: She is alert and oriented to person, place, and time. She has normal reflexes. No cranial nerve deficit. She exhibits normal muscle tone. Coordination normal.  Skin: Skin is warm and dry. No rash noted. She is not diaphoretic. No erythema. No pallor.  Psychiatric: She has a normal mood and affect. Her behavior is normal. Judgment and thought content normal.        Assessment & Plan:  #Acute respiratory failure due to pneumonia  - in retrospect could have been due to reflux from hiatal hernia  - resolved on CT chest 07/01/2014 - return for clinical followup in 9 months - advised flu shot but respect your desire to hold off  #Hiatal Hernia  -please re-establish with Dr Oretha Caprice or [facility] who you last saw in 2012  #followup  9 months or sooner if needed

## 2014-07-01 NOTE — Assessment & Plan Note (Signed)
#  Hiatal Hernia  -please re-establish with Dr Oretha Caprice or [facility] who you last saw in 2012

## 2014-07-01 NOTE — Assessment & Plan Note (Signed)
#  Acute respiratory failure due to pneumonia  - in retrospect could have been due to reflux from hiatal hernia  - resolved on CT chest 07/01/2014 - return for clinical followup in 9 months - advised flu shot but respect your desire to hold off    #followup  9 months or sooner if needed

## 2014-07-04 NOTE — Assessment & Plan Note (Signed)
#  Acute respiratory failure due to pneumonia  - in retrospect could have been due to reflux from hiatal hernia  - resolved on CT chest 07/01/2014 - return for clinical followup in 9 months - advised flu shot but respect your desire to hold off    #followup  9 months or sooner if needed

## 2014-07-08 ENCOUNTER — Ambulatory Visit
Admission: RE | Admit: 2014-07-08 | Discharge: 2014-07-08 | Disposition: A | Discharge status: Home / Self Care | Payer: Medicare Other | Source: Ambulatory Visit | Attending: Internal Medicine | Admitting: Internal Medicine

## 2014-07-08 ENCOUNTER — Encounter: Payer: Self-pay | Admitting: Gastroenterology

## 2014-07-08 DIAGNOSIS — Z1231 Encounter for screening mammogram for malignant neoplasm of breast: Secondary | ICD-10-CM

## 2014-07-08 DIAGNOSIS — E2839 Other primary ovarian failure: Secondary | ICD-10-CM

## 2014-07-10 ENCOUNTER — Other Ambulatory Visit: Payer: Self-pay | Admitting: Internal Medicine

## 2014-07-10 DIAGNOSIS — R928 Other abnormal and inconclusive findings on diagnostic imaging of breast: Secondary | ICD-10-CM

## 2014-07-22 ENCOUNTER — Other Ambulatory Visit: Payer: Medicare Other

## 2014-07-23 ENCOUNTER — Other Ambulatory Visit: Payer: Medicare Other

## 2014-07-28 ENCOUNTER — Other Ambulatory Visit: Payer: Self-pay | Admitting: Emergency Medicine

## 2014-07-28 MED ORDER — ALBUTEROL SULFATE HFA 108 (90 BASE) MCG/ACT IN AERS: 2.0000 | INHALATION_SPRAY | Freq: Four times a day (QID) | RESPIRATORY_TRACT | Status: AC | PRN

## 2014-08-05 ENCOUNTER — Ambulatory Visit
Admission: RE | Admit: 2014-08-05 | Discharge: 2014-08-05 | Disposition: A | Discharge status: Home / Self Care | Payer: Medicare Other | Source: Ambulatory Visit | Attending: Internal Medicine | Admitting: Internal Medicine

## 2014-08-05 DIAGNOSIS — R928 Other abnormal and inconclusive findings on diagnostic imaging of breast: Secondary | ICD-10-CM

## 2014-09-22 ENCOUNTER — Ambulatory Visit (INDEPENDENT_AMBULATORY_CARE_PROVIDER_SITE_OTHER): Payer: Medicare Other | Admitting: Gastroenterology

## 2014-09-22 ENCOUNTER — Encounter: Payer: Self-pay | Admitting: Gastroenterology

## 2014-09-22 VITALS — BP 142/90 | HR 68 | Ht 65.0 in | Wt 236.0 lb

## 2014-09-22 DIAGNOSIS — K449 Diaphragmatic hernia without obstruction or gangrene: Secondary | ICD-10-CM

## 2014-09-22 MED ORDER — OMEPRAZOLE 40 MG PO CPDR: 40.0000 mg | DELAYED_RELEASE_CAPSULE | Freq: Two times a day (BID) | ORAL | Status: DC

## 2014-09-22 NOTE — Progress Notes (Signed)
Review of pertinent gastrointestinal problems: 1. Adenomatous polyp, 44mm.  Colonsocopy Dr. Ardis Hughs 01/2011 done for heme negative IDA 2. EGD 01/2011 Ardis Hughs: medium sized HH, candida esophagitis, Schatzkis' ring (not dilated)  HPI: This is a    Very pleasant 68 year old woman whom I last saw over 3 years ago.  She recently had acute respiratory failure due to pneumonia. Her pulmonologist felt that perhaps her GI tract with her medium-sized hiatal hernia, GERD may have played a role in her development of pneumonia  She sits in a wheelchair. She is morbidly obese  She takes omeprazole 20mg  twice daily, BF and bedtime.  Stable weight.  INtermittent dysphagia, has choked at times.  Dysphagi with solid foods.   Review of systems: Pertinent positive and negative review of systems were noted in the above HPI section. Complete review of systems was performed and was otherwise normal.    Past Medical History  Diagnosis Date  . Fibromyalgia   . Asthma   . Hypertension   . Arthritis   . Migraines   . Pneumonia     In the 1980s    Past Surgical History  Procedure Laterality Date  . Hernia repair  1970    Current Outpatient Prescriptions  Medication Sig Dispense Refill  . albuterol (PROVENTIL HFA;VENTOLIN HFA) 108 (90 BASE) MCG/ACT inhaler Inhale 2 puffs into the lungs every 6 (six) hours as needed for wheezing. 1 Inhaler 3  . atenolol (TENORMIN) 50 MG tablet Take 50 mg by mouth daily.    . budesonide-formoterol (SYMBICORT) 160-4.5 MCG/ACT inhaler Inhale 2 puffs into the lungs 2 (two) times daily.    . butalbital-acetaminophen-caffeine (FIORICET, ESGIC) 50-325-40 MG per tablet Take 1 tablet by mouth 2 (two) times daily as needed.  5  . cyclobenzaprine (FLEXERIL) 10 MG tablet   0  . diclofenac sodium (VOLTAREN) 1 % GEL Apply 1 application topically 4 (four) times daily. Apply to knees    . fluticasone (FLONASE) 50 MCG/ACT nasal spray Place 2 sprays into both nostrils daily.  5  .  hydrochlorothiazide (HYDRODIURIL) 25 MG tablet Take 25 mg by mouth daily.    Marland Kitchen HYDROcodone-acetaminophen (NORCO) 7.5-325 MG per tablet Take 1 tablet by mouth every 6 (six) hours as needed for pain.    Marland Kitchen LORazepam (ATIVAN) 1 MG tablet Take 1 mg by mouth daily as needed.  2  . nabumetone (RELAFEN) 750 MG tablet Take 750 mg by mouth 2 (two) times daily as needed.  2  . omeprazole (PRILOSEC) 20 MG capsule Take 20 mg by mouth 2 (two) times daily.  5  . tiZANidine (ZANAFLEX) 4 MG tablet Take 4 mg by mouth at bedtime.     No current facility-administered medications for this visit.    Allergies as of 09/22/2014 - Review Complete 09/22/2014  Allergen Reaction Noted  . Aspirin  05/19/2008    Family History  Problem Relation Age of Onset  . Prostate cancer Father   . Diabetes Mother   . Diabetes Father   . Heart disease Maternal Grandfather   . Colon cancer Neg Hx   . Colon polyps Father   . Kidney disease Neg Hx   . Esophageal cancer Neg Hx     History   Social History  . Marital Status: Widowed    Spouse Name: N/A    Number of Children: 3  . Years of Education: N/A   Occupational History  . Disabled    Social History Main Topics  . Smoking status: Never Smoker   .  Smokeless tobacco: Not on file  . Alcohol Use: No  . Drug Use: No  . Sexual Activity: No   Other Topics Concern  . Not on file   Social History Narrative       Physical Exam: BP 142/90 mmHg  Pulse 68  Ht 5\' 5"  (1.651 m)  Wt 236 lb (107.049 kg)  BMI 39.27 kg/m2 Constitutional: generally well-appearing Psychiatric: alert and oriented x3 Eyes: extraocular movements intact Mouth: oral pharynx moist, no lesions Neck: supple no lymphadenopathy Cardiovascular: heart regular rate and rhythm Lungs: clear to auscultation bilaterally Abdomen: soft, nontender, nondistended, no obvious ascites, no peritoneal signs, normal bowel sounds Extremities: no lower extremity edema bilaterally Skin: no lesions on  visible extremities    Assessment and plan: 68 y.o. female with  Morbid obesity, wheelchair bound, hiatal hernia, recent pneumonia  She does not seem to be too great a candidate for repeat upper endoscopy. She has been found to have a Schatzki's ring in the past with her morbid obesity, her wheelchair-bound state, I think endoscopy sugar be reserved for urgent, emergent situations only. She does have a hiatal hernia is completely spares her to GERD. She had a Schatzki's ring noted 3 years ago. I recommended she try to chew her food very well slowly take small bites and she will increase her proton pump inhibitor to 40 mg twice daily rather than 20 mg twice daily. She will return to see me in 3 months and sooner if needed.

## 2014-09-22 NOTE — Patient Instructions (Addendum)
Increase your prilosec to 40mg  (take one pill 20-30 min before BF and dinner meals) Please return to see Dr. Ardis Hughs on 12/23/14 at 9:45 am.

## 2014-12-17 ENCOUNTER — Telehealth: Payer: Self-pay | Admitting: Gastroenterology

## 2014-12-17 NOTE — Telephone Encounter (Signed)
Pt aware that the pharmacy will send a prior auth and I will take care of it, she is aware that the Insurance has 48 hours after submission

## 2014-12-18 ENCOUNTER — Telehealth: Payer: Self-pay | Admitting: *Deleted

## 2014-12-18 NOTE — Telephone Encounter (Signed)
We sent a prescription for this pt to the pharmacy for 20 mg omeprazole twice daily . Dr Ardis Hughs note said to increase her to 40 mg tablets omeprazole twice daily and that is what the ins will cover.  I told the pharmacist to go ahead and fill it that way since that is what is in his note.

## 2014-12-22 ENCOUNTER — Other Ambulatory Visit: Payer: Self-pay

## 2014-12-22 MED ORDER — OMEPRAZOLE 40 MG PO CPDR: 40.0000 mg | DELAYED_RELEASE_CAPSULE | Freq: Two times a day (BID) | ORAL | Status: DC

## 2014-12-22 NOTE — Telephone Encounter (Signed)
Error

## 2014-12-22 NOTE — Telephone Encounter (Signed)
Pt prescription was re written for 40 mg twice daily,

## 2014-12-23 ENCOUNTER — Ambulatory Visit: Payer: Medicare Other | Admitting: Gastroenterology

## 2015-02-22 ENCOUNTER — Other Ambulatory Visit: Payer: Self-pay | Admitting: Internal Medicine

## 2015-02-22 DIAGNOSIS — N6320 Unspecified lump in the left breast, unspecified quadrant: Secondary | ICD-10-CM

## 2015-03-03 ENCOUNTER — Ambulatory Visit
Admission: RE | Admit: 2015-03-03 | Discharge: 2015-03-03 | Disposition: A | Discharge status: Home / Self Care | Payer: Commercial Managed Care - HMO | Source: Ambulatory Visit | Attending: Internal Medicine | Admitting: Internal Medicine

## 2015-03-03 ENCOUNTER — Ambulatory Visit (INDEPENDENT_AMBULATORY_CARE_PROVIDER_SITE_OTHER): Payer: Commercial Managed Care - HMO | Admitting: Gastroenterology

## 2015-03-03 ENCOUNTER — Encounter: Payer: Self-pay | Admitting: Gastroenterology

## 2015-03-03 VITALS — BP 136/80 | HR 76 | Ht 65.0 in | Wt 242.0 lb

## 2015-03-03 DIAGNOSIS — N6320 Unspecified lump in the left breast, unspecified quadrant: Secondary | ICD-10-CM

## 2015-03-03 DIAGNOSIS — K219 Gastro-esophageal reflux disease without esophagitis: Secondary | ICD-10-CM

## 2015-03-03 NOTE — Progress Notes (Signed)
Review of pertinent gastrointestinal problems: 1. Adenomatous polyp, 63mm. Colonsocopy Dr. Ardis Hughs 01/2011 done for heme negative IDA 2. EGD 01/2011 Ardis Hughs: medium sized HH, candida esophagitis, Schatzkis' ring (not dilated) 3. Poor endoscopic candidate given morbid obesity, wheelchair bound  HPI: This is a   very pleasant 69 year old woman who is here with her daughter and grandson today. I last saw her for 5 months ago. She is still in a wheelchair.  Has been on omeprazole 40mg  twice daily.  Feels much better.  She has no dysphagia, no weight loss, no overt GI bleeding  Chief complaint is GERD   Past Medical History  Diagnosis Date  . Fibromyalgia   . Asthma   . Hypertension   . Arthritis   . Migraines   . Pneumonia     In the 1980s    Past Surgical History  Procedure Laterality Date  . Hernia repair  1970    Current Outpatient Prescriptions  Medication Sig Dispense Refill  . albuterol (PROVENTIL HFA;VENTOLIN HFA) 108 (90 BASE) MCG/ACT inhaler Inhale 2 puffs into the lungs every 6 (six) hours as needed for wheezing. 1 Inhaler 3  . atenolol (TENORMIN) 50 MG tablet Take 50 mg by mouth daily.    . budesonide-formoterol (SYMBICORT) 160-4.5 MCG/ACT inhaler Inhale 2 puffs into the lungs 2 (two) times daily.    . butalbital-acetaminophen-caffeine (FIORICET, ESGIC) 50-325-40 MG per tablet Take 1 tablet by mouth 2 (two) times daily as needed.  5  . diclofenac sodium (VOLTAREN) 1 % GEL Apply 1 application topically 4 (four) times daily. Apply to knees    . hydrochlorothiazide (HYDRODIURIL) 25 MG tablet Take 25 mg by mouth daily.    Marland Kitchen HYDROcodone-acetaminophen (NORCO) 7.5-325 MG per tablet Take 1 tablet by mouth every 6 (six) hours as needed for pain.    Marland Kitchen ipratropium (ATROVENT) 0.06 % nasal spray Place 1 spray into both nostrils 2 (two) times daily.   5  . LORazepam (ATIVAN) 1 MG tablet Take 1 mg by mouth daily as needed.  2  . methocarbamol (ROBAXIN) 750 MG tablet Take 750 mg by mouth 2  (two) times daily as needed.  5  . nabumetone (RELAFEN) 750 MG tablet Take 750 mg by mouth 2 (two) times daily as needed.  2  . omeprazole (PRILOSEC) 40 MG capsule Take 1 capsule (40 mg total) by mouth 2 (two) times daily. 60 capsule 11  . PATADAY 0.2 % SOLN Place 1 drop into both eyes daily.   2  . tiZANidine (ZANAFLEX) 4 MG tablet Take 4 mg by mouth at bedtime.     No current facility-administered medications for this visit.    Allergies as of 03/03/2015 - Review Complete 03/03/2015  Allergen Reaction Noted  . Aspirin  05/19/2008    Family History  Problem Relation Age of Onset  . Prostate cancer Father   . Diabetes Mother   . Diabetes Father   . Heart disease Maternal Grandfather   . Colon cancer Neg Hx   . Colon polyps Father   . Kidney disease Neg Hx   . Esophageal cancer Neg Hx     History   Social History  . Marital Status: Widowed    Spouse Name: N/A  . Number of Children: 3  . Years of Education: N/A   Occupational History  . Disabled    Social History Main Topics  . Smoking status: Never Smoker   . Smokeless tobacco: Not on file  . Alcohol Use: No  .  Drug Use: No  . Sexual Activity: No   Other Topics Concern  . Not on file   Social History Narrative     Physical Exam: Ht 5\' 5"  (1.651 m)  Wt 242 lb (109.77 kg)  BMI 40.27 kg/m2 Constitutional: Morbidly obese, sits in a wheelchair Psychiatric: alert and oriented x3 Abdomen: soft, nontender, nondistended, no obvious ascites, no peritoneal signs, normal bowel sounds   Assessment and plan: 69 y.o. female with chronic GERD without alarm symptoms, morbid obesity  I recommended she continue on twice daily proton pump inhibitor I explained to her that I'm happy to refill this prescription medicine in one year without need for an office appointment however she still needs prescription proton pump inhibitor in 2 years I would like to see her in the office before another prescription. She is morbidly obese  and I recommended she talk with her primary care physician about weight loss strategies.   Owens Loffler, MD [facility] 03/03/2015, 4:08 PM

## 2015-03-03 NOTE — Patient Instructions (Addendum)
Your body mass index (BMI) is elevated.  This is a calculation of your weight/height and it suggests that you are overweight or obese.  You should follow up with your primary caregiver to discuss weight loss strategies. You should continue omeprazole 40mg  pills, one pill before breakfast and dinner meals.

## 2015-08-02 ENCOUNTER — Other Ambulatory Visit: Payer: Self-pay | Admitting: Internal Medicine

## 2015-08-02 ENCOUNTER — Other Ambulatory Visit: Payer: Self-pay

## 2015-08-02 DIAGNOSIS — N6320 Unspecified lump in the left breast, unspecified quadrant: Secondary | ICD-10-CM

## 2015-08-27 ENCOUNTER — Other Ambulatory Visit: Payer: Commercial Managed Care - HMO

## 2015-09-03 ENCOUNTER — Ambulatory Visit
Admission: RE | Admit: 2015-09-03 | Discharge: 2015-09-03 | Disposition: A | Discharge status: Home / Self Care | Payer: Commercial Managed Care - HMO | Source: Ambulatory Visit | Attending: Internal Medicine | Admitting: Internal Medicine

## 2015-09-03 DIAGNOSIS — N6320 Unspecified lump in the left breast, unspecified quadrant: Secondary | ICD-10-CM

## 2015-12-17 ENCOUNTER — Other Ambulatory Visit: Payer: Self-pay | Admitting: Gastroenterology

## 2016-01-27 ENCOUNTER — Encounter: Payer: Self-pay | Admitting: Gastroenterology

## 2016-10-20 ENCOUNTER — Emergency Department (HOSPITAL_COMMUNITY): Payer: Medicare HMO

## 2016-10-20 ENCOUNTER — Emergency Department (HOSPITAL_COMMUNITY)
Admission: EM | Admit: 2016-10-20 | Discharge: 2016-10-20 | Disposition: A | Discharge status: Home / Self Care | Payer: Medicare HMO | Attending: Emergency Medicine | Admitting: Emergency Medicine

## 2016-10-20 ENCOUNTER — Encounter (HOSPITAL_COMMUNITY): Payer: Self-pay | Admitting: Emergency Medicine

## 2016-10-20 ENCOUNTER — Emergency Department (HOSPITAL_BASED_OUTPATIENT_CLINIC_OR_DEPARTMENT_OTHER)
Admit: 2016-10-20 | Discharge: 2016-10-20 | Disposition: A | Discharge status: Home / Self Care | Payer: Medicare HMO | Attending: Emergency Medicine | Admitting: Emergency Medicine

## 2016-10-20 DIAGNOSIS — D649 Anemia, unspecified: Secondary | ICD-10-CM | POA: Insufficient documentation

## 2016-10-20 DIAGNOSIS — J189 Pneumonia, unspecified organism: Secondary | ICD-10-CM | POA: Diagnosis not present

## 2016-10-20 DIAGNOSIS — M7121 Synovial cyst of popliteal space [Baker], right knee: Secondary | ICD-10-CM | POA: Insufficient documentation

## 2016-10-20 DIAGNOSIS — M79604 Pain in right leg: Secondary | ICD-10-CM | POA: Diagnosis not present

## 2016-10-20 DIAGNOSIS — I10 Essential (primary) hypertension: Secondary | ICD-10-CM | POA: Insufficient documentation

## 2016-10-20 DIAGNOSIS — M7989 Other specified soft tissue disorders: Secondary | ICD-10-CM | POA: Diagnosis not present

## 2016-10-20 DIAGNOSIS — R4182 Altered mental status, unspecified: Secondary | ICD-10-CM | POA: Diagnosis not present

## 2016-10-20 DIAGNOSIS — J019 Acute sinusitis, unspecified: Secondary | ICD-10-CM | POA: Insufficient documentation

## 2016-10-20 LAB — COMPREHENSIVE METABOLIC PANEL
ALBUMIN: 4.3 g/dL (ref 3.5–5.0)
ALT: 7 U/L — ABNORMAL LOW (ref 14–54)
ANION GAP: 10 (ref 5–15)
AST: 18 U/L (ref 15–41)
Alkaline Phosphatase: 84 U/L (ref 38–126)
BUN: 23 mg/dL — ABNORMAL HIGH (ref 6–20)
CHLORIDE: 101 mmol/L (ref 101–111)
CO2: 24 mmol/L (ref 22–32)
Calcium: 9.2 mg/dL (ref 8.9–10.3)
Creatinine, Ser: 1.15 mg/dL — ABNORMAL HIGH (ref 0.44–1.00)
GFR calc non Af Amer: 47 mL/min — ABNORMAL LOW (ref >60)
GFR, EST AFRICAN AMERICAN: 55 mL/min — AB (ref >60)
GLUCOSE: 106 mg/dL — AB (ref 65–99)
POTASSIUM: 4.4 mmol/L (ref 3.5–5.1)
SODIUM: 135 mmol/L (ref 135–145)
Total Bilirubin: 0.3 mg/dL (ref 0.3–1.2)
Total Protein: 7.8 g/dL (ref 6.5–8.1)

## 2016-10-20 LAB — CBC WITH DIFFERENTIAL/PLATELET
Basophils Absolute: 0 10*3/uL (ref 0.0–0.1)
Basophils Relative: 0 %
Eosinophils Absolute: 0 10*3/uL (ref 0.0–0.7)
Eosinophils Relative: 0 %
HCT: 28 % — ABNORMAL LOW (ref 36.0–46.0)
Hemoglobin: 8.6 g/dL — ABNORMAL LOW (ref 12.0–15.0)
LYMPHS ABS: 1.3 10*3/uL (ref 0.7–4.0)
LYMPHS PCT: 24 %
MCH: 24.4 pg — ABNORMAL LOW (ref 26.0–34.0)
MCHC: 30.7 g/dL (ref 30.0–36.0)
MCV: 79.5 fL (ref 78.0–100.0)
MONO ABS: 0.3 10*3/uL (ref 0.1–1.0)
MONOS PCT: 6 %
NEUTROS ABS: 3.7 10*3/uL (ref 1.7–7.7)
NEUTROS PCT: 70 %
Platelets: 425 10*3/uL — ABNORMAL HIGH (ref 150–400)
RBC: 3.52 MIL/uL — ABNORMAL LOW (ref 3.87–5.11)
RDW: 18 % — AB (ref 11.5–15.5)
WBC: 5.3 10*3/uL (ref 4.0–10.5)

## 2016-10-20 LAB — URINALYSIS, ROUTINE W REFLEX MICROSCOPIC
BILIRUBIN URINE: NEGATIVE
Glucose, UA: NEGATIVE mg/dL
Hgb urine dipstick: NEGATIVE
KETONES UR: NEGATIVE mg/dL
LEUKOCYTES UA: NEGATIVE
NITRITE: NEGATIVE
PH: 5 (ref 5.0–8.0)
Protein, ur: NEGATIVE mg/dL
SPECIFIC GRAVITY, URINE: 1.011 (ref 1.005–1.030)

## 2016-10-20 MED ORDER — SODIUM CHLORIDE 0.9 % IV SOLN
INTRAVENOUS | Status: DC
  Administered 2016-10-20: 06:00:00 via INTRAVENOUS

## 2016-10-20 MED ORDER — DEXTROSE 5 % IV SOLN
1.0000 g | Freq: Once | INTRAVENOUS | Status: AC
  Administered 2016-10-20: 1 g via INTRAVENOUS
  Filled 2016-10-20: qty 10

## 2016-10-20 MED ORDER — LEVOFLOXACIN 500 MG PO TABS: 500.0000 mg | ORAL_TABLET | Freq: Every day | ORAL | 0 refills | Status: AC

## 2016-10-20 MED ORDER — LEVOFLOXACIN 500 MG PO TABS: 500.0000 mg | ORAL_TABLET | Freq: Every day | ORAL | 0 refills | Status: DC

## 2016-10-20 MED ORDER — SODIUM CHLORIDE 0.9 % IV BOLUS (SEPSIS)
500.0000 mL | Freq: Once | INTRAVENOUS | Status: AC
  Administered 2016-10-20: 500 mL via INTRAVENOUS

## 2016-10-20 MED ORDER — DEXTROSE 5 % IV SOLN
500.0000 mg | Freq: Once | INTRAVENOUS | Status: AC
  Administered 2016-10-20: 500 mg via INTRAVENOUS
  Filled 2016-10-20: qty 500

## 2016-10-20 NOTE — Progress Notes (Signed)
VASCULAR LAB PRELIMINARY  PRELIMINARY  PRELIMINARY  PRELIMINARY  Right lower extremity venous duplex completed.    Preliminary report:  No evidence of right lower extremity DVT or superficial thrombosis. There is an area of mixed echoes coursing 8.37 cm from the popliteal fossa into the proximal calf consistent with a ruptured Baker's cyst.  Rahsaan Weakland, RVS 10/20/2016, 9:08 AM

## 2016-10-20 NOTE — ED Provider Notes (Signed)
9:12 AM Improved at this time. Dc home with pcp and orthopedics followup. Ruptured bakers cyst. No hypoxia. No increased work of breathing. Pt and pts family understand to return to the ER for new or worsening symptoms. Home with abx   Jola Schmidt, MD 10/20/16 [phone]

## 2016-10-20 NOTE — ED Triage Notes (Signed)
Pt brought in by EMS for c/o left leg and foot pain  Per EMS pt is c/o some thigh pain as well  The pt's daughter told EMS that the pt has been more "sluggish" today than normal  Pt has not had a fever   Pt denies fall or trauma  Upon arrival pt is unable to report why she is here  Confused in triage

## 2016-10-20 NOTE — ED Notes (Signed)
Pt out of room for imaging.  Will give abx once she is back

## 2016-10-20 NOTE — ED Notes (Signed)
Vascular tech at the bedside.  

## 2016-10-20 NOTE — ED Provider Notes (Signed)
[facility] DEPT Provider Note   CSN: [id number] Arrival date & time: 10/20/16  0148   By signing my name below, I, Eunice Blase, attest that this documentation has been prepared under the direction and in the presence of Daleen Bo, MD. Electronically signed, Eunice Blase, ED Scribe. 10/20/16. 2:36 AM.   History   Chief Complaint Chief Complaint  Patient presents with  . Leg Pain    The history is provided by a relative. No language interpreter was used.    HPI Comments: Shirley Schneider is a 70 y.o. female who presents to the Emergency Department complaining of off and on, gradually worsening, severe right leg pain and swelling. Family notes arthralgias on pt's right knee and ankle. Family members endorse productive coughing, wheezing, intermittent disorientation and confusion x 2 months (pt gradually loses orientation to place and time during episodes), fatigue, and low extremity and back arthralgias. 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 PMHx of HTN, pneumonia, bronchitis and asthmas further noted. Family states that pt is normally happy and bubbly, but currently disoriented. Her PCP is Dr. Jeanie Cooks. Relatives deny fever and injury or fall.      Past Medical History:  Diagnosis Date  . Arthritis   . Asthma   . Fibromyalgia   . Hypertension   . Migraines   . Pneumonia    In the 1980s    Patient Active Problem List   Diagnosis Date Noted  . Hiatal hernia 07/01/2014  . Acute on chronic respiratory failure with hypoxia ([place]) 04/27/2014  . CAP (community acquired pneumonia) 04/27/2014  . Physical deconditioning 04/13/2014  . Acute respiratory failure with hypoxia ([place]) 04/05/2014  . Shock circulatory ([place]) 04/05/2014  . Congestive heart failure (CHF) ([place]) 04/04/2014  . PULMONARY INFILTRATES-PNEUMOPATHY 04/29/2008  . HYPERTENSION 02/06/2008    Past Surgical History:  Procedure Laterality Date  . HERNIA REPAIR  1970    OB History    No data available       Home Medications    Prior to Admission medications   Medication Sig Start Date End Date Taking? Authorizing Provider  albuterol (PROVENTIL HFA;VENTOLIN HFA) 108 (90 BASE) MCG/ACT inhaler Inhale 2 puffs into the lungs every 6 (six) hours as needed for wheezing. 07/28/14   Brand Males, MD  atenolol (TENORMIN) 50 MG tablet Take 50 mg by mouth daily.    Historical Provider, MD  budesonide-formoterol (SYMBICORT) 160-4.5 MCG/ACT inhaler Inhale 2 puffs into the lungs 2 (two) times daily.    Historical Provider, MD  butalbital-acetaminophen-caffeine (FIORICET, ESGIC) 50-325-40 MG per tablet Take 1 tablet by mouth 2 (two) times daily as needed. 09/08/14   Historical Provider, MD  diclofenac sodium (VOLTAREN) 1 % GEL Apply 1 application topically 4 (four) times daily. Apply to knees    Historical Provider, MD  hydrochlorothiazide (HYDRODIURIL) 25 MG tablet Take 25 mg by mouth daily.    Historical Provider, MD  HYDROcodone-acetaminophen (NORCO) 7.5-325 MG per tablet Take 1 tablet by mouth every 6 (six) hours as needed for pain.    Historical Provider, MD  ipratropium (ATROVENT) 0.06 % nasal spray Place 1 spray into both nostrils 2 (two) times daily.  01/06/15   Historical Provider, MD  LORazepam (ATIVAN) 1 MG tablet Take 1 mg by mouth daily as needed. 08/25/14   Historical Provider, MD  methocarbamol (ROBAXIN) 750 MG tablet Take 750 mg by mouth 2 (two) times daily as needed. 02/04/15   Historical Provider, MD  nabumetone (RELAFEN) 750 MG tablet  Take 750 mg by mouth 2 (two) times daily as needed. 08/24/14   Historical Provider, MD  omeprazole (PRILOSEC) 40 MG capsule TAKE 1 CAPSULE (40 MG TOTAL) BY MOUTH 2 (TWO) TIMES DAILY. 12/17/15   Milus Banister, MD  PATADAY 0.2 % SOLN Place 1 drop into both eyes daily.  02/04/15   Historical Provider, MD  tiZANidine (ZANAFLEX) 4 MG tablet Take 4 mg by mouth at bedtime.    Historical Provider, MD    Family History Family History  Problem  Relation Age of Onset  . Prostate cancer Father   . Diabetes Father   . Colon polyps Father   . Diabetes Mother   . Heart disease Maternal Grandfather   . Colon cancer Neg Hx   . Kidney disease Neg Hx   . Esophageal cancer Neg Hx     Social History Social History  Substance Use Topics  . Smoking status: Never Smoker  . Smokeless tobacco: Never Used  . Alcohol use No     Allergies   Aspirin   Review of Systems Review of Systems  Constitutional: Positive for fatigue. Negative for fever.  Respiratory: Positive for cough (productive) and wheezing.   Musculoskeletal: Positive for arthralgias, back pain, gait problem and joint swelling.  Psychiatric/Behavioral: Positive for confusion and decreased concentration.  All other systems reviewed and are negative.    Physical Exam Updated Vital Signs BP 140/92   Pulse 82   Temp 98.1 F (36.7 C) (Oral)   Resp 16   SpO2 100%   Physical Exam  Constitutional: She is oriented to person, place, and time. She appears well-developed and well-nourished.  HENT:  Head: Normocephalic and atraumatic.  Eyes: Conjunctivae and EOM are normal. Pupils are equal, round, and reactive to light.  Neck: Normal range of motion and phonation normal. Neck supple.  Cardiovascular: Normal rate and regular rhythm.   Pulmonary/Chest: Effort normal. She has no wheezes. She has no rales.  Scanner ronchi. No rales or wheezing.  Abdominal: Soft. She exhibits no distension. There is no tenderness. There is no guarding.  Musculoskeletal: Normal range of motion.       Right knee: She exhibits swelling.       Right ankle: She exhibits swelling.  Right leg swollen from knee down.  Neurological: She is alert and oriented to person, place, and time. She exhibits normal muscle tone.  Skin: Skin is warm and dry.  Psychiatric: She has a normal mood and affect. Her behavior is normal. Judgment and thought content normal.  Nursing note and vitals  reviewed.    ED Treatments / Results  DIAGNOSTIC STUDIES: Oxygen Saturation is 100% on RA, normal by my interpretation.    COORDINATION OF CARE: 2:39 AM Discussed treatment plan with pt at bedside and pt agreed to plan.  Labs (all labs ordered are listed, but only abnormal results are displayed) Labs Reviewed - No data to display  EKG  EKG Interpretation None       Radiology No results found.  Procedures Procedures (including critical care time)  Medications Ordered in ED Medications - No data to display   Initial Impression / Assessment and Plan / ED Course  I have reviewed the triage vital signs and the nursing notes.  Pertinent labs & imaging results that were available during my care of the patient were reviewed by me and considered in my medical decision making (see chart for details).  Clinical Course as of Oct 20 513  Fri Oct 20, 2016  0505 Elevated above baseline Creatinine: (!) 1.15 [EW]  0506 Normal WBC: 5.3 [EW]  0506 Lower than baseline Hemoglobin: (!) 8.6 [EW]  0506 CT Head Wo Contrast [EW]  0506 DG Chest 2 View [EW]  0506 DG Shoulder Right [EW]  0506 IV Rocephin and Zithromax ordered, for sinusitis and community-acquired pneumonia.  [EW]    Clinical Course User Index [EW] Daleen Bo, MD    Medications  cefTRIAXone (ROCEPHIN) 1 g in dextrose 5 % 50 mL IVPB (not administered)  azithromycin (ZITHROMAX) 500 mg in dextrose 5 % 250 mL IVPB (not administered)  0.9 %  sodium chloride infusion (not administered)  sodium chloride 0.9 % bolus 500 mL (500 mLs Intravenous New Bag/Given 10/20/16 0333)    Patient Vitals for the past 24 hrs:  BP Temp Temp src Pulse Resp SpO2  10/20/16 0426 130/93 98.3 F (36.8 C) Oral 74 16 96 %  10/20/16 0154 140/92 98.1 F (36.7 C) Oral 82 16 100 %  10/20/16 0148 - - - - - 95 %    5:14 AM Reevaluation with update and discussion. After initial assessment and treatment, an updated evaluation reveals she is resting  comfortably. Awaiting doppler right leg. Discussed with daughter, anticipate d/c home. Shirley Schneider    Final Clinical Impressions(s) / ED Diagnoses   Final diagnoses:  Right leg swelling  Acute sinusitis, recurrence not specified, unspecified location  Anemia, unspecified type  Altered mental status, unspecified altered mental status type  Community acquired pneumonia, unspecified laterality  Baker's cyst of knee, right    Nursing Notes Reviewed/ Care Coordinated Applicable Imaging Reviewed Interpretation of Laboratory Data incorporated into ED treatment  New Prescriptions New Prescriptions   No medications on file  I personally performed the services described in this documentation, which was scribed in my presence. The recorded information has been reviewed and is accurate.     Daleen Bo, MD 10/20/16 2312

## 2016-10-20 NOTE — ED Notes (Signed)
Handheld doppler performed on right leg by RN.  Pulse audible in dorsalis pedis and spot marked with pen.  MD aware.

## 2016-10-20 NOTE — ED Notes (Signed)
ED Provider at bedside. 

## 2016-11-01 ENCOUNTER — Emergency Department (HOSPITAL_COMMUNITY): Payer: Medicare HMO

## 2016-11-01 ENCOUNTER — Encounter (HOSPITAL_COMMUNITY): Payer: Self-pay | Admitting: *Deleted

## 2016-11-01 ENCOUNTER — Emergency Department (HOSPITAL_COMMUNITY)
Admission: EM | Admit: 2016-11-01 | Discharge: 2016-11-02 | Disposition: A | Discharge status: Home / Self Care | Payer: Medicare HMO | Attending: Emergency Medicine | Admitting: Emergency Medicine

## 2016-11-01 DIAGNOSIS — R05 Cough: Secondary | ICD-10-CM

## 2016-11-01 DIAGNOSIS — J45909 Unspecified asthma, uncomplicated: Secondary | ICD-10-CM | POA: Diagnosis not present

## 2016-11-01 DIAGNOSIS — Z79899 Other long term (current) drug therapy: Secondary | ICD-10-CM | POA: Diagnosis not present

## 2016-11-01 DIAGNOSIS — J029 Acute pharyngitis, unspecified: Secondary | ICD-10-CM | POA: Diagnosis not present

## 2016-11-01 DIAGNOSIS — I509 Heart failure, unspecified: Secondary | ICD-10-CM | POA: Diagnosis not present

## 2016-11-01 DIAGNOSIS — I110 Hypertensive heart disease with heart failure: Secondary | ICD-10-CM | POA: Insufficient documentation

## 2016-11-01 DIAGNOSIS — R0989 Other specified symptoms and signs involving the circulatory and respiratory systems: Secondary | ICD-10-CM

## 2016-11-01 DIAGNOSIS — R059 Cough, unspecified: Secondary | ICD-10-CM

## 2016-11-01 LAB — COMPREHENSIVE METABOLIC PANEL
ALK PHOS: 77 U/L (ref 38–126)
ALT: 8 U/L — ABNORMAL LOW (ref 14–54)
ANION GAP: 9 (ref 5–15)
AST: 14 U/L — ABNORMAL LOW (ref 15–41)
Albumin: 4.3 g/dL (ref 3.5–5.0)
BILIRUBIN TOTAL: 0.4 mg/dL (ref 0.3–1.2)
BUN: 13 mg/dL (ref 6–20)
CALCIUM: 9.2 mg/dL (ref 8.9–10.3)
CO2: 23 mmol/L (ref 22–32)
Chloride: 108 mmol/L (ref 101–111)
Creatinine, Ser: 0.96 mg/dL (ref 0.44–1.00)
GFR, EST NON AFRICAN AMERICAN: 59 mL/min — AB (ref >60)
Glucose, Bld: 100 mg/dL — ABNORMAL HIGH (ref 65–99)
POTASSIUM: 3.9 mmol/L (ref 3.5–5.1)
Sodium: 140 mmol/L (ref 135–145)
TOTAL PROTEIN: 7 g/dL (ref 6.5–8.1)

## 2016-11-01 LAB — CBC WITH DIFFERENTIAL/PLATELET
BASOS ABS: 0 10*3/uL (ref 0.0–0.1)
BASOS PCT: 1 %
EOS ABS: 0.1 10*3/uL (ref 0.0–0.7)
Eosinophils Relative: 2 %
HEMATOCRIT: 26.1 % — AB (ref 36.0–46.0)
HEMOGLOBIN: 7.9 g/dL — AB (ref 12.0–15.0)
Lymphocytes Relative: 46 %
Lymphs Abs: 3.9 10*3/uL (ref 0.7–4.0)
MCH: 23 pg — ABNORMAL LOW (ref 26.0–34.0)
MCHC: 30.3 g/dL (ref 30.0–36.0)
MCV: 76.1 fL — ABNORMAL LOW (ref 78.0–100.0)
MONOS PCT: 7 %
Monocytes Absolute: 0.6 10*3/uL (ref 0.1–1.0)
NEUTROS ABS: 3.6 10*3/uL (ref 1.7–7.7)
NEUTROS PCT: 44 %
Platelets: 473 10*3/uL — ABNORMAL HIGH (ref 150–400)
RBC: 3.43 MIL/uL — ABNORMAL LOW (ref 3.87–5.11)
RDW: 18.5 % — AB (ref 11.5–15.5)
WBC: 8.3 10*3/uL (ref 4.0–10.5)

## 2016-11-01 LAB — CBG MONITORING, ED: GLUCOSE-CAPILLARY: 104 mg/dL — AB (ref 65–99)

## 2016-11-01 LAB — I-STAT TROPONIN, ED: Troponin i, poc: 0.01 ng/mL (ref 0.00–0.08)

## 2016-11-01 LAB — MONONUCLEOSIS SCREEN: MONO SCREEN: NEGATIVE

## 2016-11-01 LAB — RAPID STREP SCREEN (MED CTR MEBANE ONLY): Streptococcus, Group A Screen (Direct): NEGATIVE

## 2016-11-01 LAB — LIPASE, BLOOD: Lipase: 24 U/L (ref 11–51)

## 2016-11-01 NOTE — ED Triage Notes (Addendum)
Pt reports fever and sore throat, was seen here 2 weeks ago and was found to have PNA, was put on abx.  Started to feel bad yesterday.  Was sent here from [facility], was tested for the flu and strep which were negative.  Pt also reports productive cough with yellowish sputum.

## 2016-11-01 NOTE — Discharge Instructions (Signed)
Your strep and mono tests were negative today. Your x-ray was also negative for pneumonia.  Your symptoms are most likely due to a virus affecting your tonsils and throat. Please be aware that this may progress into an abscess (pus pocket). Please return to the emergency department if you develop worsening of symptoms, if your tonsils enlarge or if have difficulty controlling your oral secretions or neck stiffness or pain.   Please read attached information on "pharyngitis" and "cough".   Please take  tylenol every 6 hours for the next 3 days for inflammation and pain in your throat. Continue using mucinex, albuterol and drinking plenty of fluids.  Honey in warm water, salt water gargles, chloraseptic throat spray, lozenges will help with throat pain.   Return to the emergency department as needed. Please call and make an appointment with your primary care provider in the next 5-7 days for further discussion of your symptoms.

## 2016-11-02 NOTE — ED Provider Notes (Signed)
[facility] DEPT Provider Note   CSN: [id number] Arrival date & time: 11/01/16  2048     History   Chief Complaint Chief Complaint  Patient presents with  . Sore Throat    HPI Shirley Schneider is a 70 y.o. female with pmh of asthma, hypertension and recently treated PNA on 10/20/16 presents with sore throat, productive cough, and chest tightness/congestion and low grade fever x 2 days. Pt denies chest pain, shortness of breath, n/v/d/c, body aches, fatigue.  Pt states she has been taking mucinex, drinking a lot of fluids, and using albuterol inhaler for symptoms.  Pt states albuterol helps with breathing.  Pt states nothing has helped with sore throat.  Pt's relative in room mentioned pt has been c/o nausea and vomiting.  Pt's relative also concerned that pt seems to be more forgetful lately.    HPI  Past Medical History:  Diagnosis Date  . Arthritis   . Asthma   . Fibromyalgia   . Hypertension   . Migraines   . Pneumonia    In the 1980s    Patient Active Problem List   Diagnosis Date Noted  . Hiatal hernia 07/01/2014  . Acute on chronic respiratory failure with hypoxia ([place]) 04/27/2014  . CAP (community acquired pneumonia) 04/27/2014  . Physical deconditioning 04/13/2014  . Acute respiratory failure with hypoxia ([place]) 04/05/2014  . Shock circulatory ([place]) 04/05/2014  . Congestive heart failure (CHF) ([place]) 04/04/2014  . PULMONARY INFILTRATES-PNEUMOPATHY 04/29/2008  . HYPERTENSION 02/06/2008    Past Surgical History:  Procedure Laterality Date  . HERNIA REPAIR  1970    OB History    No data available       Home Medications    Prior to Admission medications   Medication Sig Start Date End Date Taking? Authorizing Provider  albuterol (PROVENTIL HFA;VENTOLIN HFA) 108 (90 BASE) MCG/ACT inhaler Inhale 2 puffs into the lungs every 6 (six) hours as needed for wheezing. 07/28/14   Brand Males, MD  albuterol (PROVENTIL) (2.5 MG/3ML) 0.083% nebulizer solution  Take 2.5 mg by nebulization every 6 (six) hours as needed for wheezing or shortness of breath.    Historical Provider, MD  atenolol (TENORMIN) 50 MG tablet Take 50 mg by mouth daily.    Historical Provider, MD  budesonide-formoterol (SYMBICORT) 160-4.5 MCG/ACT inhaler Inhale 2 puffs into the lungs 2 (two) times daily.    Historical Provider, MD  butalbital-acetaminophen-caffeine (FIORICET, ESGIC) 50-325-40 MG per tablet Take 1 tablet by mouth 2 (two) times daily as needed for headache.  09/08/14   Historical Provider, MD  diclofenac sodium (VOLTAREN) 1 % GEL Apply 1 application topically 4 (four) times daily. Apply to knees    Historical Provider, MD  hydrochlorothiazide (HYDRODIURIL) 25 MG tablet Take 25 mg by mouth daily.    Historical Provider, MD  HYDROcodone-acetaminophen (NORCO) 7.5-325 MG per tablet Take 1 tablet by mouth every 6 (six) hours as needed for pain.    Historical Provider, MD  LORazepam (ATIVAN) 1 MG tablet Take 1 mg by mouth daily as needed for anxiety.  08/25/14   Historical Provider, MD  methocarbamol (ROBAXIN) 750 MG tablet Take 750 mg by mouth 2 (two) times daily as needed for muscle spasms.  02/04/15   Historical Provider, MD  nabumetone (RELAFEN) 750 MG tablet Take 750 mg by mouth 2 (two) times daily as needed for mild pain.  08/24/14   Historical Provider, MD  omeprazole (PRILOSEC) 40 MG capsule TAKE 1 CAPSULE (40 MG TOTAL) BY MOUTH 2 (  TWO) TIMES DAILY. 12/17/15   Milus Banister, MD  tiZANidine (ZANAFLEX) 4 MG tablet Take 4 mg by mouth at bedtime.    Historical Provider, MD    Family History Family History  Problem Relation Age of Onset  . Prostate cancer Father   . Diabetes Father   . Colon polyps Father   . Diabetes Mother   . Heart disease Maternal Grandfather   . Colon cancer Neg Hx   . Kidney disease Neg Hx   . Esophageal cancer Neg Hx     Social History Social History  Substance Use Topics  . Smoking status: Never Smoker  . Smokeless tobacco: Never Used    . Alcohol use No     Allergies   Aspirin   Review of Systems Review of Systems  Constitutional: Positive for fever. Negative for chills and diaphoresis.  HENT: Positive for congestion and sore throat. Negative for postnasal drip, rhinorrhea, sinus pain and sneezing.   Eyes: Negative for visual disturbance.  Respiratory: Positive for cough and chest tightness. Negative for choking and shortness of breath.   Cardiovascular: Negative for chest pain, palpitations and leg swelling.  Gastrointestinal: Positive for nausea and vomiting. Negative for abdominal pain.  Genitourinary: Negative for dysuria and pelvic pain.  Musculoskeletal: Negative for back pain.  Neurological: Negative for dizziness, syncope, light-headedness, numbness and headaches.  Hematological: Does not bruise/bleed easily.  Psychiatric/Behavioral: Negative for agitation.     Physical Exam Updated Vital Signs BP 146/87 (BP Location: Right Arm)   Pulse 68   Temp 98.5 F (36.9 C) (Oral)   Resp 18   Ht 5\' 5"  (1.651 m)   Wt 88.5 kg   SpO2 96%   BMI 32.45 kg/m   Physical Exam  Constitutional: She is oriented to person, place, and time. Vital signs are normal. She appears well-developed and well-nourished. No distress.  Pt in wheelchair, in NAD, speaking in full sentences.  HENT:  Head: Normocephalic and atraumatic.  Nose: Nose normal.  Mildly erythematous oropharynx w/o exudates, moist.  S/p tonsillectomy. Uvula midline.  No pooling of oral secretions. No trismus.  Eyes: Conjunctivae and EOM are normal. Pupils are equal, round, and reactive to light.  Neck: Normal range of motion. Neck supple.  Cardiovascular: Normal rate, regular rhythm, normal heart sounds and intact distal pulses.   No murmur heard. Pulmonary/Chest: Effort normal and breath sounds normal. No respiratory distress. She has no wheezes. She has no rales.  Abdominal: Soft. Bowel sounds are normal. She exhibits no distension. There is no  tenderness.  Musculoskeletal: Normal range of motion.  Lymphadenopathy:    She has no cervical adenopathy.  Neurological: She is alert and oriented to person, place, and time.  Skin: Skin is warm and dry. Capillary refill takes less than 2 seconds.  Psychiatric: She has a normal mood and affect. Her behavior is normal.  Pt oriented to self, place and time.  Judgement and thought content normal.  Pt is a good historian.  Nursing note and vitals reviewed.    ED Treatments / Results  Labs (all labs ordered are listed, but only abnormal results are displayed) Labs Reviewed  COMPREHENSIVE METABOLIC PANEL - Abnormal; Notable for the following:       Result Value   Glucose, Bld 100 (*)    AST 14 (*)    ALT 8 (*)    GFR calc non Af Amer 59 (*)    All other components within normal limits  CBC WITH DIFFERENTIAL/PLATELET -  Abnormal; Notable for the following:    RBC 3.43 (*)    Hemoglobin 7.9 (*)    HCT 26.1 (*)    MCV 76.1 (*)    MCH 23.0 (*)    RDW 18.5 (*)    Platelets 473 (*)    All other components within normal limits  CBG MONITORING, ED - Abnormal; Notable for the following:    Glucose-Capillary 104 (*)    All other components within normal limits  RAPID STREP SCREEN (NOT AT [facility])  CULTURE, GROUP A STREP ([place])  LIPASE, BLOOD  MONONUCLEOSIS SCREEN  I-STAT TROPOININ, ED    EKG  EKG Interpretation None       Radiology Dg Chest 2 View  Result Date: 11/01/2016 CLINICAL DATA:  Cough and dyspnea x3 weeks. EXAM: CHEST  2 VIEW COMPARISON:  10/20/2016 CXR and CT chest 07/01/2014 FINDINGS: Moderate-sized hiatal hernia projects over the top normal size cardiac silhouette. The thoracic aorta is ectatic in appearance. There is mild vascular congestion with clearing of right upper lobe pneumonia since prior exam. No effusion or pneumothorax. No suspicious osseous abnormalities. IMPRESSION: Moderate-sized hiatal hernia. Clearing of right upper lobe pneumonia. Mild vascular  congestion is seen without effusion. Electronically Signed   By: Ashley Royalty M.D.   On: 11/01/2016 22:11    Procedures Procedures (including critical care time)  Medications Ordered in ED Medications - No data to display   Initial Impression / Assessment and Plan / ED Course  I have reviewed the triage vital signs and the nursing notes.  Pertinent labs & imaging results that were available during my care of the patient were reviewed by me and considered in my medical decision making (see chart for details).  Clinical Course     Pt's relative reported pt had been c/o nausea, vomiting.  Pt's relative also expressed concern for recent forgetfulness.  Pt did not voice these concerns to me during encounter. Physical exam unremarkable, lungs CTAB, mildly erythematous oropharynx. VSS. CXR showed resolved PNA and hiatal hernia.  CXR findings discussed with pt and pt's relative, advised to f/u with PCP in 5-7 days regarding hiatal hernia.  EKG and troponin negative. Lipase, CMP, strep and mono negative. CBC without leukocytosis, remarkable for anemia with hgb 7.9.  Pt reports h/o anemia currently asymptomatic.  Per chart review, pt's hgb has been in the low 8's before.  Pt has never required transfusion.   Overall ED work up unremarkable.  Will discharge pt with conservative treatment for sore throat, cough and chest congestion with PCP f/u in 5-7 days.  Pt instructed to take tylenol for throat pain. Pt aware to monitor symptoms, aware of red flag symptoms for deep neck abscess. Pt and relative verbalized understanding and agreeable to plan.  Final Clinical Impressions(s) / ED Diagnoses   Final diagnoses:  Sore throat  Cough  Chest congestion    New Prescriptions Discharge Medication List as of 11/01/2016 11:41 PM       Kinnie Feil, PA-C 11/02/16 [id number]    Carmin Muskrat, MD 11/02/16 [phone]

## 2016-11-04 LAB — CULTURE, GROUP A STREP (THRC)

## 2016-11-10 ENCOUNTER — Other Ambulatory Visit: Payer: Self-pay | Admitting: Gastroenterology

## 2016-12-26 ENCOUNTER — Other Ambulatory Visit: Payer: Self-pay | Admitting: Gastroenterology

## 2016-12-31 ENCOUNTER — Inpatient Hospital Stay (HOSPITAL_COMMUNITY)
Admission: EM | Admit: 2016-12-31 | Discharge: 2017-01-02 | DRG: 195 | Disposition: A | Discharge status: Home / Self Care | Payer: Medicare HMO | Attending: Family Medicine | Admitting: Family Medicine

## 2016-12-31 ENCOUNTER — Emergency Department (HOSPITAL_COMMUNITY): Payer: Medicare HMO

## 2016-12-31 ENCOUNTER — Encounter (HOSPITAL_COMMUNITY): Payer: Self-pay | Admitting: Emergency Medicine

## 2016-12-31 DIAGNOSIS — I4510 Unspecified right bundle-branch block: Secondary | ICD-10-CM

## 2016-12-31 DIAGNOSIS — Z6832 Body mass index (BMI) 32.0-32.9, adult: Secondary | ICD-10-CM

## 2016-12-31 DIAGNOSIS — J45909 Unspecified asthma, uncomplicated: Secondary | ICD-10-CM | POA: Diagnosis present

## 2016-12-31 DIAGNOSIS — I440 Atrioventricular block, first degree: Secondary | ICD-10-CM

## 2016-12-31 DIAGNOSIS — I10 Essential (primary) hypertension: Secondary | ICD-10-CM | POA: Diagnosis present

## 2016-12-31 DIAGNOSIS — M797 Fibromyalgia: Secondary | ICD-10-CM | POA: Diagnosis present

## 2016-12-31 DIAGNOSIS — Z79899 Other long term (current) drug therapy: Secondary | ICD-10-CM

## 2016-12-31 DIAGNOSIS — J181 Lobar pneumonia, unspecified organism: Secondary | ICD-10-CM | POA: Diagnosis not present

## 2016-12-31 DIAGNOSIS — F0391 Unspecified dementia with behavioral disturbance: Secondary | ICD-10-CM

## 2016-12-31 DIAGNOSIS — E66811 Obesity, class 1: Secondary | ICD-10-CM

## 2016-12-31 DIAGNOSIS — M1990 Unspecified osteoarthritis, unspecified site: Secondary | ICD-10-CM | POA: Diagnosis present

## 2016-12-31 DIAGNOSIS — J189 Pneumonia, unspecified organism: Secondary | ICD-10-CM | POA: Diagnosis present

## 2016-12-31 DIAGNOSIS — R05 Cough: Secondary | ICD-10-CM | POA: Diagnosis present

## 2016-12-31 DIAGNOSIS — E669 Obesity, unspecified: Secondary | ICD-10-CM

## 2016-12-31 HISTORY — DX: Heart failure, unspecified: I50.9

## 2016-12-31 LAB — CBC WITH DIFFERENTIAL/PLATELET
BASOS ABS: 0 10*3/uL (ref 0.0–0.1)
Basophils Relative: 0 %
EOS PCT: 3 %
Eosinophils Absolute: 0.2 10*3/uL (ref 0.0–0.7)
HEMATOCRIT: 26.6 % — AB (ref 36.0–46.0)
Hemoglobin: 8 g/dL — ABNORMAL LOW (ref 12.0–15.0)
LYMPHS PCT: 22 %
Lymphs Abs: 1.7 10*3/uL (ref 0.7–4.0)
MCH: 23.1 pg — AB (ref 26.0–34.0)
MCHC: 30.1 g/dL (ref 30.0–36.0)
MCV: 76.9 fL — AB (ref 78.0–100.0)
MONO ABS: 0.4 10*3/uL (ref 0.1–1.0)
MONOS PCT: 5 %
Neutro Abs: 5.3 10*3/uL (ref 1.7–7.7)
Neutrophils Relative %: 70 %
PLATELETS: 473 10*3/uL — AB (ref 150–400)
RBC: 3.46 MIL/uL — ABNORMAL LOW (ref 3.87–5.11)
RDW: 18.5 % — AB (ref 11.5–15.5)
WBC: 7.6 10*3/uL (ref 4.0–10.5)

## 2016-12-31 LAB — COMPREHENSIVE METABOLIC PANEL
ALBUMIN: 4.6 g/dL (ref 3.5–5.0)
ALT: 9 U/L — AB (ref 14–54)
AST: 14 U/L — ABNORMAL LOW (ref 15–41)
Alkaline Phosphatase: 87 U/L (ref 38–126)
Anion gap: 8 (ref 5–15)
BILIRUBIN TOTAL: 0.1 mg/dL — AB (ref 0.3–1.2)
BUN: 13 mg/dL (ref 6–20)
CHLORIDE: 108 mmol/L (ref 101–111)
CO2: 20 mmol/L — ABNORMAL LOW (ref 22–32)
CREATININE: 0.98 mg/dL (ref 0.44–1.00)
Calcium: 9.6 mg/dL (ref 8.9–10.3)
GFR calc Af Amer: >60 mL/min (ref >60)
GFR calc non Af Amer: 58 mL/min — ABNORMAL LOW (ref >60)
GLUCOSE: 92 mg/dL (ref 65–99)
Potassium: 3.8 mmol/L (ref 3.5–5.1)
Sodium: 136 mmol/L (ref 135–145)
TOTAL PROTEIN: 8.2 g/dL — AB (ref 6.5–8.1)

## 2016-12-31 LAB — URINALYSIS, ROUTINE W REFLEX MICROSCOPIC
BACTERIA UA: NONE SEEN
Bilirubin Urine: NEGATIVE
Glucose, UA: NEGATIVE mg/dL
Hgb urine dipstick: NEGATIVE
Ketones, ur: NEGATIVE mg/dL
Nitrite: NEGATIVE
PH: 5 (ref 5.0–8.0)
Protein, ur: NEGATIVE mg/dL
SPECIFIC GRAVITY, URINE: 1.01 (ref 1.005–1.030)

## 2016-12-31 LAB — LACTIC ACID, PLASMA
LACTIC ACID, VENOUS: 0.9 mmol/L (ref 0.5–1.9)
LACTIC ACID, VENOUS: 0.9 mmol/L (ref 0.5–1.9)

## 2016-12-31 LAB — CBG MONITORING, ED: GLUCOSE-CAPILLARY: 83 mg/dL (ref 65–99)

## 2016-12-31 LAB — STREP PNEUMONIAE URINARY ANTIGEN: STREP PNEUMO URINARY ANTIGEN: NEGATIVE

## 2016-12-31 MED ORDER — AMLODIPINE BESYLATE 5 MG PO TABS
5.0000 mg | ORAL_TABLET | Freq: Every day | ORAL | Status: DC
  Administered 2016-12-31 – 2017-01-02 (×3): 5 mg via ORAL
  Filled 2016-12-31 (×3): qty 1

## 2016-12-31 MED ORDER — HYDROCHLOROTHIAZIDE 25 MG PO TABS
25.0000 mg | ORAL_TABLET | Freq: Every day | ORAL | Status: DC
  Administered 2016-12-31 – 2017-01-02 (×3): 25 mg via ORAL
  Filled 2016-12-31 (×3): qty 1

## 2016-12-31 MED ORDER — ENSURE ENLIVE PO LIQD: 237.0000 mL | Freq: Two times a day (BID) | ORAL | Status: DC

## 2016-12-31 MED ORDER — SODIUM CHLORIDE 0.9 % IV BOLUS (SEPSIS)
500.0000 mL | Freq: Once | INTRAVENOUS | Status: AC
  Administered 2016-12-31: 500 mL via INTRAVENOUS

## 2016-12-31 MED ORDER — MOMETASONE FURO-FORMOTEROL FUM 200-5 MCG/ACT IN AERO
2.0000 | INHALATION_SPRAY | Freq: Two times a day (BID) | RESPIRATORY_TRACT | Status: DC
  Administered 2016-12-31 – 2017-01-02 (×4): 2 via RESPIRATORY_TRACT
  Filled 2016-12-31: qty 8.8

## 2016-12-31 MED ORDER — ENOXAPARIN SODIUM 40 MG/0.4ML ~~LOC~~ SOLN
40.0000 mg | SUBCUTANEOUS | Status: DC
  Administered 2016-12-31 – 2017-01-02 (×3): 40 mg via SUBCUTANEOUS
  Filled 2016-12-31 (×3): qty 0.4

## 2016-12-31 MED ORDER — DEXTROSE 5 % IV SOLN
1.0000 g | Freq: Once | INTRAVENOUS | Status: AC
  Administered 2016-12-31: 1 g via INTRAVENOUS
  Filled 2016-12-31: qty 10

## 2016-12-31 MED ORDER — TRAZODONE HCL 50 MG PO TABS
100.0000 mg | ORAL_TABLET | Freq: Every evening | ORAL | Status: DC | PRN
  Administered 2017-01-01: 100 mg via ORAL
  Filled 2016-12-31: qty 2

## 2016-12-31 MED ORDER — ONDANSETRON HCL 4 MG/2ML IJ SOLN: 4.0000 mg | Freq: Four times a day (QID) | INTRAMUSCULAR | Status: DC | PRN

## 2016-12-31 MED ORDER — ONDANSETRON HCL 4 MG PO TABS: 4.0000 mg | ORAL_TABLET | Freq: Four times a day (QID) | ORAL | Status: DC | PRN

## 2016-12-31 MED ORDER — ACETAMINOPHEN 650 MG RE SUPP: 650.0000 mg | Freq: Four times a day (QID) | RECTAL | Status: DC | PRN

## 2016-12-31 MED ORDER — DEXTROSE 5 % IV SOLN
1.0000 g | INTRAVENOUS | Status: DC
  Administered 2017-01-01 – 2017-01-02 (×2): 1 g via INTRAVENOUS
  Filled 2016-12-31 (×2): qty 10

## 2016-12-31 MED ORDER — DARIFENACIN HYDROBROMIDE ER 7.5 MG PO TB24
7.5000 mg | ORAL_TABLET | Freq: Every day | ORAL | Status: DC
  Administered 2016-12-31 – 2017-01-02 (×3): 7.5 mg via ORAL
  Filled 2016-12-31 (×3): qty 1

## 2016-12-31 MED ORDER — ACETAMINOPHEN 325 MG PO TABS
650.0000 mg | ORAL_TABLET | Freq: Four times a day (QID) | ORAL | Status: DC | PRN
  Administered 2016-12-31 – 2017-01-02 (×3): 650 mg via ORAL
  Filled 2016-12-31 (×3): qty 2

## 2016-12-31 MED ORDER — ALBUTEROL SULFATE (2.5 MG/3ML) 0.083% IN NEBU: 2.5000 mg | INHALATION_SOLUTION | Freq: Four times a day (QID) | RESPIRATORY_TRACT | Status: DC | PRN

## 2016-12-31 MED ORDER — LORAZEPAM 1 MG PO TABS: 1.0000 mg | ORAL_TABLET | Freq: Every day | ORAL | Status: DC | PRN

## 2016-12-31 MED ORDER — DICLOFENAC SODIUM 1 % TD GEL
1.0000 "application " | Freq: Four times a day (QID) | TRANSDERMAL | Status: DC
  Administered 2016-12-31 – 2017-01-02 (×9): 1 via TOPICAL
  Filled 2016-12-31: qty 100

## 2016-12-31 MED ORDER — PANTOPRAZOLE SODIUM 40 MG PO TBEC
40.0000 mg | DELAYED_RELEASE_TABLET | Freq: Every day | ORAL | Status: DC
  Administered 2016-12-31 – 2017-01-02 (×3): 40 mg via ORAL
  Filled 2016-12-31 (×3): qty 1

## 2016-12-31 MED ORDER — SODIUM CHLORIDE 0.9 % IV SOLN
INTRAVENOUS | Status: DC
  Administered 2016-12-31: 12:00:00 via INTRAVENOUS
  Administered 2017-01-01: 1000 mL via INTRAVENOUS
  Administered 2017-01-02: 04:00:00 via INTRAVENOUS

## 2016-12-31 MED ORDER — DEXTROSE 5 % IV SOLN
500.0000 mg | INTRAVENOUS | Status: DC
  Administered 2017-01-01 – 2017-01-02 (×2): 500 mg via INTRAVENOUS
  Filled 2016-12-31 (×2): qty 500

## 2016-12-31 MED ORDER — POLYETHYLENE GLYCOL 3350 17 G PO PACK: 17.0000 g | PACK | Freq: Every day | ORAL | Status: DC | PRN

## 2016-12-31 MED ORDER — DEXTROSE 5 % IV SOLN
500.0000 mg | Freq: Once | INTRAVENOUS | Status: AC
  Administered 2016-12-31: 500 mg via INTRAVENOUS
  Filled 2016-12-31: qty 500

## 2016-12-31 NOTE — ED Notes (Signed)
PA student at bedside.

## 2016-12-31 NOTE — H&P (Signed)
History and Physical  Shirley Schneider [id number] DOB: [birth year] DOA: 12/31/2016  Referring physician: Charlann Lange, ER PA  PCP: Philis Fendt, MD  Outpatient Specialists: None Patient coming from: Home & is able to ambulate using a walker, although she does not always use it  Chief Complaint: Cough and confusion  HPI: Shirley Schneider is a 71 y.o. female with medical history significant of obesity, hypertension who is had several weeks of nonproductive cough and decreased by mouth intake. She was seen by her PCP and started on Augmentin almost 2 weeks ago. This is not changed her symptoms. Patient was at home with her daughter is quite independently functioning. Daughter became concerned when patient started to show signs of confusion and so brought the patient into the emergency room.  ED Course: In the emergency room, patient noted to have a normal white count and normal lactic acid level, and CT scan of the head was unremarkable. A chest x-ray noted multilobar pneumonia of the right lung. On exam, patient was found to be alert and oriented 2, but did not know what year it was. Hospitalists were called for further evaluation  Review of Systems: Patient seen after arrival to floor . Pt complains of some nonproductive cough and feeling very tired.  Pt denies any headaches, vision changes, dysphagia, chest pain, palpitations, shortness of breath, wheeze, abdominal pain, hematuria, dysuria, constipation, diarrhea, focal extremity numbness weakness or pain .  Review of systems are otherwise negative   Past Medical History:  Diagnosis Date  . Arthritis   . Asthma   . Fibromyalgia   . Hypertension   . Migraines   . Pneumonia    In the 1980s   Past Surgical History:  Procedure Laterality Date  . HERNIA REPAIR  1970    Social History:  reports that she has never smoked. She has never used smokeless tobacco. She reports that she does not drink alcohol or use drugs. Patient lives  at home with her daughter. She is quite independent.   Allergies  Allergen Reactions  . Aspirin     upstomach    Family History  Problem Relation Age of Onset  . Prostate cancer Father   . Diabetes Father   . Colon polyps Father   . Diabetes Mother   . Heart disease Maternal Grandfather   . Colon cancer Neg Hx   . Kidney disease Neg Hx   . Esophageal cancer Neg Hx       Prior to Admission medications   Medication Sig Start Date End Date Taking? Authorizing Provider  albuterol (PROVENTIL HFA;VENTOLIN HFA) 108 (90 BASE) MCG/ACT inhaler Inhale 2 puffs into the lungs every 6 (six) hours as needed for wheezing. 07/28/14  Yes Brand Males, MD  albuterol (PROVENTIL) (2.5 MG/3ML) 0.083% nebulizer solution Take 2.5 mg by nebulization every 6 (six) hours as needed for wheezing or shortness of breath.   Yes Historical Provider, MD  amLODipine (NORVASC) 5 MG tablet Take 5 mg by mouth daily. 12/07/16  Yes Historical Provider, MD  budesonide-formoterol (SYMBICORT) 160-4.5 MCG/ACT inhaler Inhale 2 puffs into the lungs 2 (two) times daily.   Yes Historical Provider, MD  butalbital-acetaminophen-caffeine (FIORICET, ESGIC) 50-325-40 MG per tablet Take 1 tablet by mouth 2 (two) times daily as needed for headache.  09/08/14  Yes Historical Provider, MD  dextromethorphan-guaiFENesin (MUCINEX DM) 30-600 MG 12hr tablet Take 1 tablet by mouth 2 (two) times daily.   Yes Historical Provider, MD  diclofenac sodium (VOLTAREN) 1 %  GEL Apply 1 application topically 4 (four) times daily. Apply to knees   Yes Historical Provider, MD  hydrochlorothiazide (HYDRODIURIL) 25 MG tablet Take 25 mg by mouth daily.   Yes Historical Provider, MD  HYDROcodone-acetaminophen (NORCO) 10-325 MG tablet Take 1 tablet by mouth every 6 (six) hours as needed. 12/08/16  Yes Historical Provider, MD  levalbuterol Penne Lash HFA) 45 MCG/ACT inhaler Inhale 2 puffs into the lungs every 6 (six) hours as needed. 12/07/16  Yes Historical Provider,  MD  LORazepam (ATIVAN) 1 MG tablet Take 1 mg by mouth daily as needed for anxiety.  08/25/14  Yes Historical Provider, MD  methocarbamol (ROBAXIN) 750 MG tablet Take 750 mg by mouth 2 (two) times daily as needed for muscle spasms.  02/04/15  Yes Historical Provider, MD  omeprazole (PRILOSEC) 40 MG capsule TAKE 1 CAPSULE (40 MG TOTAL) BY MOUTH 2 (TWO) TIMES DAILY. 12/26/16  Yes Milus Banister, MD  ondansetron (ZOFRAN-ODT) 4 MG disintegrating tablet Take 1 tablet by mouth 2 (two) times daily. 10/26/16  Yes Historical Provider, MD  tiZANidine (ZANAFLEX) 4 MG tablet Take 4 mg by mouth at bedtime.   Yes Historical Provider, MD  traZODone (DESYREL) 100 MG tablet Take 100 mg by mouth at bedtime as needed. for sleep 12/26/16  Yes Historical Provider, MD  VESICARE 5 MG tablet Take 5 mg by mouth daily. 12/26/16  Yes Historical Provider, MD    Physical Exam: BP 133/67 (BP Location: Right Arm)   Pulse 65   Temp 97.7 F (36.5 C) (Oral)   Resp 18   Ht 5\' 5"  (1.651 m)   Wt 87.5 kg (193 lb)   SpO2 100%   BMI 32.12 kg/m   General:  Alert and oriented 2, patient no she's in the hospital, but does not know what year it is  Eyes: Sclera nonicteric, extracted movements are intact  ENT: Normocephalic and natruretic, mixed membranes are slightly dry  Neck: Supple, no JVD  Cardiovascular: Regular rate and rhythm, S1-S2  Respiratory: Few crackles on right side  Abdomen: Soft, nontender, nondistended, positive bowel sounds  Skin: No skin breaks, tears or lesions  Musculoskeletal: No clubbing or cyanosis, trace pitting edema  Psychiatric: Patient is appropriate, no evidence of psychoses  Neurologic: No focal deficits         Labs on Admission:  Basic Metabolic Panel:  Recent Labs Lab 12/31/16 0239  NA 136  K 3.8  CL 108  CO2 20*  GLUCOSE 92  BUN 13  CREATININE 0.98  CALCIUM 9.6   Liver Function Tests:  Recent Labs Lab 12/31/16 0239  AST 14*  ALT 9*  ALKPHOS 87  BILITOT 0.1*  PROT 8.2*    ALBUMIN 4.6   No results for input(s): LIPASE, AMYLASE in the last 168 hours. No results for input(s): AMMONIA in the last 168 hours. CBC:  Recent Labs Lab 12/31/16 0239  WBC 7.6  NEUTROABS 5.3  HGB 8.0*  HCT 26.6*  MCV 76.9*  PLT 473*   Cardiac Enzymes: No results for input(s): CKTOTAL, CKMB, CKMBINDEX, TROPONINI in the last 168 hours.  BNP (last 3 results) No results for input(s): BNP in the last 8760 hours.  ProBNP (last 3 results) No results for input(s): PROBNP in the last 8760 hours.  CBG:  Recent Labs Lab 12/31/16 0259  GLUCAP 83    Radiological Exams on Admission: Dg Chest 2 View  Result Date: 12/31/2016 CLINICAL DATA:  Initial evaluation for acute cough, confusion. EXAM: CHEST  2 VIEW  COMPARISON:  Prior radiograph from 11/01/2016. FINDINGS: Stable cardiomegaly. Mediastinal silhouette within normal limits. Hiatal hernia noted. Lungs are hypoinflated. Patchy multifocal opacities within the right upper and lower lobes, concerning for possible infiltrate. Left lung grossly clear. No pulmonary edema. No pleural effusion. No acute osseus abnormality. Degenerative changes noted about the shoulders. IMPRESSION: 1. Patchy multifocal opacities within the right upper and lower lobes, concerning for possible infiltrates. 2. Hiatal hernia. Electronically Signed   By: Jeannine Boga M.D.   On: 12/31/2016 06:04    EKG: Not done   Assessment/Plan Present on Admission: . CAP (community acquired pneumonia): Stable. Failed outpatient by mouth antibiotics. Have started her on IV Rocephin and Zithromax. When necessary nebulizers.  Of note, patient is on a significant amount of sedating medications including when necessary Norco, when necessary Ativan, when necessary Robaxin plus trazodone and Zanaflex at night. We'll check swallow evaluation to ensure that she does not have silent aspiration given multi lobar pneumonia  . Essential hypertension:Continue home  medications  confusion:? If this is acute versus underlying dementia being brought out more because of illness. Patient is quite alert and oriented and cooperative other than just not knowing what year it is   Obesity: Patient meets criteria with BMI greater than 30  Active Problems:   Essential hypertension   CAP (community acquired pneumonia)   DVT prophylaxis:  Lovenox  Code Status: full code as per patient   Family Communication: left message with daughter   Disposition Plan: anticipate she will be here for several days   Consults called: none   Admission status: given patient's need for inpatient stay beyond 2 midnights, have placed her an inpatient status    Annita Brod MD [facility] Pager [phone]  If 7PM-7AM, please contact night-coverage [email or web address] Password [email or web address]  12/31/2016, 10:09 AM

## 2016-12-31 NOTE — ED Notes (Signed)
No respiratory or acute distress noted resting in bed with eyes closed visitors at bedside call light in reach.

## 2016-12-31 NOTE — ED Triage Notes (Signed)
Patient has alter mental status per family. Patient has been acting like this for a week. Patient is antibiotics for bronchitis. Patient family states patient has been getting worse since she was put on the antibiotics.

## 2016-12-31 NOTE — ED Notes (Signed)
No respiratory or acute distress noted alert and talking visitors at bedside call light in reach.

## 2016-12-31 NOTE — ED Provider Notes (Signed)
[facility] DEPT Provider Note   CSN: [id number] Arrival date & time: 12/31/16  0211     History   Chief Complaint Chief Complaint  Patient presents with  . Fall  . Altered Mental Status    HPI Shirley Schneider is a 71 y.o. female.  Patient is brought in by family member who is caregiver with history of several days of cough and progressively worsening confusion. She is not eating or drinking significant amounts. No vomiting, diarrhea or urinary symptoms of pain or malodor. She has been treated for bronchitis by her PCP and was put on Augmentin 12 days ago. Symptoms have worsened despite compliance. The patient lives at home with family and functions independently, able to take care of her own personal needs including cooking and cleaning.   The history is provided by the patient and a relative. No language interpreter was used.  Fall  Pertinent negatives include no chest pain and no abdominal pain.  Altered Mental Status   Associated symptoms include weakness.    Past Medical History:  Diagnosis Date  . Arthritis   . Asthma   . Fibromyalgia   . Hypertension   . Migraines   . Pneumonia    In the 1980s    Patient Active Problem List   Diagnosis Date Noted  . Hiatal hernia 07/01/2014  . Acute on chronic respiratory failure with hypoxia ([place]) 04/27/2014  . CAP (community acquired pneumonia) 04/27/2014  . Physical deconditioning 04/13/2014  . Acute respiratory failure with hypoxia ([place]) 04/05/2014  . Shock circulatory ([place]) 04/05/2014  . Congestive heart failure (CHF) ([place]) 04/04/2014  . PULMONARY INFILTRATES-PNEUMOPATHY 04/29/2008  . HYPERTENSION 02/06/2008    Past Surgical History:  Procedure Laterality Date  . HERNIA REPAIR  1970    OB History    No data available       Home Medications    Prior to Admission medications   Medication Sig Start Date End Date Taking? Authorizing Provider  albuterol (PROVENTIL HFA;VENTOLIN HFA) 108 (90 BASE) MCG/ACT  inhaler Inhale 2 puffs into the lungs every 6 (six) hours as needed for wheezing. 07/28/14   Brand Males, MD  albuterol (PROVENTIL) (2.5 MG/3ML) 0.083% nebulizer solution Take 2.5 mg by nebulization every 6 (six) hours as needed for wheezing or shortness of breath.    Historical Provider, MD  atenolol (TENORMIN) 50 MG tablet Take 50 mg by mouth daily.    Historical Provider, MD  budesonide-formoterol (SYMBICORT) 160-4.5 MCG/ACT inhaler Inhale 2 puffs into the lungs 2 (two) times daily.    Historical Provider, MD  butalbital-acetaminophen-caffeine (FIORICET, ESGIC) 50-325-40 MG per tablet Take 1 tablet by mouth 2 (two) times daily as needed for headache.  09/08/14   Historical Provider, MD  diclofenac sodium (VOLTAREN) 1 % GEL Apply 1 application topically 4 (four) times daily. Apply to knees    Historical Provider, MD  hydrochlorothiazide (HYDRODIURIL) 25 MG tablet Take 25 mg by mouth daily.    Historical Provider, MD  HYDROcodone-acetaminophen (NORCO) 7.5-325 MG per tablet Take 1 tablet by mouth every 6 (six) hours as needed for pain.    Historical Provider, MD  LORazepam (ATIVAN) 1 MG tablet Take 1 mg by mouth daily as needed for anxiety.  08/25/14   Historical Provider, MD  methocarbamol (ROBAXIN) 750 MG tablet Take 750 mg by mouth 2 (two) times daily as needed for muscle spasms.  02/04/15   Historical Provider, MD  nabumetone (RELAFEN) 750 MG tablet Take 750 mg by mouth 2 (two) times  daily as needed for mild pain.  08/24/14   Historical Provider, MD  omeprazole (PRILOSEC) 40 MG capsule TAKE 1 CAPSULE (40 MG TOTAL) BY MOUTH 2 (TWO) TIMES DAILY. 12/26/16   Milus Banister, MD  tiZANidine (ZANAFLEX) 4 MG tablet Take 4 mg by mouth at bedtime.    Historical Provider, MD    Family History Family History  Problem Relation Age of Onset  . Prostate cancer Father   . Diabetes Father   . Colon polyps Father   . Diabetes Mother   . Heart disease Maternal Grandfather   . Colon cancer Neg Hx   .  Kidney disease Neg Hx   . Esophageal cancer Neg Hx     Social History Social History  Substance Use Topics  . Smoking status: Never Smoker  . Smokeless tobacco: Never Used  . Alcohol use No     Allergies   Aspirin   Review of Systems Review of Systems  Unable to perform ROS: Other (Confusion)  Constitutional: Positive for appetite change. Negative for chills and fever.  HENT: Negative.   Respiratory: Positive for cough.   Cardiovascular: Negative.  Negative for chest pain.  Gastrointestinal: Negative.  Negative for abdominal pain and vomiting.  Genitourinary: Negative for frequency.  Musculoskeletal: Negative.  Negative for arthralgias and myalgias.  Skin: Negative.  Negative for color change and wound.  Neurological: Positive for weakness. Negative for syncope.     Physical Exam Updated Vital Signs BP 139/99 (BP Location: Left Arm)   Pulse 64   Temp 97.5 F (36.4 C) (Oral)   Resp 18   Ht 5\' 5"  (1.651 m)   Wt 88.5 kg   SpO2 100%   BMI 32.45 kg/m   Physical Exam  Constitutional: She appears well-developed and well-nourished.  HENT:  Head: Normocephalic and atraumatic.  Neck: Normal range of motion. Neck supple.  Cardiovascular: Normal rate and regular rhythm.   Pulmonary/Chest: Effort normal. She has rales. She exhibits no tenderness.  Abdominal: Soft. Bowel sounds are normal. There is no tenderness. There is no rebound and no guarding.  Musculoskeletal: Normal range of motion. She exhibits no edema.  Neurological: She is alert. Coordination normal.  Oriented to person and place.   Skin: Skin is warm and dry. No rash noted.  Psychiatric: She has a normal mood and affect.     ED Treatments / Results  Labs (all labs ordered are listed, but only abnormal results are displayed) Labs Reviewed  COMPREHENSIVE METABOLIC PANEL - Abnormal; Notable for the following:       Result Value   CO2 20 (*)    Total Protein 8.2 (*)    AST 14 (*)    ALT 9 (*)    Total  Bilirubin 0.1 (*)    GFR calc non Af Amer 58 (*)    All other components within normal limits  CBC WITH DIFFERENTIAL/PLATELET - Abnormal; Notable for the following:    RBC 3.46 (*)    Hemoglobin 8.0 (*)    HCT 26.6 (*)    MCV 76.9 (*)    MCH 23.1 (*)    RDW 18.5 (*)    Platelets 473 (*)    All other components within normal limits  URINE CULTURE  URINALYSIS, ROUTINE W REFLEX MICROSCOPIC  LACTIC ACID, PLASMA  LACTIC ACID, PLASMA  CBG MONITORING, ED   Results for orders placed or performed during the hospital encounter of 12/31/16  Comprehensive metabolic panel  Result Value Ref Range  Sodium 136 135 - 145 mmol/L   Potassium 3.8 3.5 - 5.1 mmol/L   Chloride 108 101 - 111 mmol/L   CO2 20 (L) 22 - 32 mmol/L   Glucose, Bld 92 65 - 99 mg/dL   BUN 13 6 - 20 mg/dL   Creatinine, Ser 0.98 0.44 - 1.00 mg/dL   Calcium 9.6 8.9 - 10.3 mg/dL   Total Protein 8.2 (H) 6.5 - 8.1 g/dL   Albumin 4.6 3.5 - 5.0 g/dL   AST 14 (L) 15 - 41 U/L   ALT 9 (L) 14 - 54 U/L   Alkaline Phosphatase 87 38 - 126 U/L   Total Bilirubin 0.1 (L) 0.3 - 1.2 mg/dL   GFR calc non Af Amer 58 (L) >60 mL/min   GFR calc Af Amer >60 >60 mL/min   Anion gap 8 5 - 15  CBC with Differential  Result Value Ref Range   WBC 7.6 4.0 - 10.5 K/uL   RBC 3.46 (L) 3.87 - 5.11 MIL/uL   Hemoglobin 8.0 (L) 12.0 - 15.0 g/dL   HCT 26.6 (L) 36.0 - 46.0 %   MCV 76.9 (L) 78.0 - 100.0 fL   MCH 23.1 (L) 26.0 - 34.0 pg   MCHC 30.1 30.0 - 36.0 g/dL   RDW 18.5 (H) 11.5 - 15.5 %   Platelets 473 (H) 150 - 400 K/uL   Neutrophils Relative % 70 %   Neutro Abs 5.3 1.7 - 7.7 K/uL   Lymphocytes Relative 22 %   Lymphs Abs 1.7 0.7 - 4.0 K/uL   Monocytes Relative 5 %   Monocytes Absolute 0.4 0.1 - 1.0 K/uL   Eosinophils Relative 3 %   Eosinophils Absolute 0.2 0.0 - 0.7 K/uL   Basophils Relative 0 %   Basophils Absolute 0.0 0.0 - 0.1 K/uL  Urinalysis, Routine w reflex microscopic  Result Value Ref Range   Color, Urine YELLOW YELLOW    APPearance CLEAR CLEAR   Specific Gravity, Urine 1.010 1.005 - 1.030   pH 5.0 5.0 - 8.0   Glucose, UA NEGATIVE NEGATIVE mg/dL   Hgb urine dipstick NEGATIVE NEGATIVE   Bilirubin Urine NEGATIVE NEGATIVE   Ketones, ur NEGATIVE NEGATIVE mg/dL   Protein, ur NEGATIVE NEGATIVE mg/dL   Nitrite NEGATIVE NEGATIVE   Leukocytes, UA TRACE (A) NEGATIVE   RBC / HPF 0-5 0 - 5 RBC/hpf   WBC, UA 0-5 0 - 5 WBC/hpf   Bacteria, UA NONE SEEN NONE SEEN   Squamous Epithelial / LPF 0-5 (A) NONE SEEN   Mucous PRESENT   Lactic acid, plasma  Result Value Ref Range   Lactic Acid, Venous 0.9 0.5 - 1.9 mmol/L  CBG monitoring, ED  Result Value Ref Range   Glucose-Capillary 83 65 - 99 mg/dL    EKG  EKG Interpretation None       Radiology No results found. Dg Chest 2 View  Result Date: 12/31/2016 CLINICAL DATA:  Initial evaluation for acute cough, confusion. EXAM: CHEST  2 VIEW COMPARISON:  Prior radiograph from 11/01/2016. FINDINGS: Stable cardiomegaly. Mediastinal silhouette within normal limits. Hiatal hernia noted. Lungs are hypoinflated. Patchy multifocal opacities within the right upper and lower lobes, concerning for possible infiltrate. Left lung grossly clear. No pulmonary edema. No pleural effusion. No acute osseus abnormality. Degenerative changes noted about the shoulders. IMPRESSION: 1. Patchy multifocal opacities within the right upper and lower lobes, concerning for possible infiltrates. 2. Hiatal hernia. Electronically Signed   By: Jeannine Boga M.D.   On:  12/31/2016 06:04    Procedures Procedures (including critical care time)  Medications Ordered in ED Medications  sodium chloride 0.9 % bolus 500 mL (not administered)     Initial Impression / Assessment and Plan / ED Course  I have reviewed the triage vital signs and the nursing notes.  Pertinent labs & imaging results that were available during my care of the patient were reviewed by me and considered in my medical decision  making (see chart for details).     Patient brought in by family who are caregivers with complaint of cough with progressive confusion and weakness. She is not eating or drinking. On Augmentin for bronchitis, now with x-ray evidence of multi-focal pneumonia.  Patient has remained stable. Normal Lactate, no sign of sepsis. Will admit to hospitalist for IV abx.   Final Clinical Impressions(s) / ED Diagnoses   Final diagnoses:  None   1. Multi-focal pneumonia.  New Prescriptions New Prescriptions   No medications on file     Charlann Lange, PA-C 12/31/16 [place], MD 01/01/17 [phone]

## 2016-12-31 NOTE — ED Notes (Signed)
No reaction to medication noted resting in bed with eyes closed call light in reach no respiratory or acute distress noted visitors at bedside.

## 2016-12-31 NOTE — Evaluation (Signed)
Clinical/Bedside Swallow Evaluation Patient Details  Name: Shirley Schneider MRN: [id number] Date of Birth: 01/28/1946  Today's Date: 12/31/2016 Time: SLP Start Time (ACUTE ONLY): 1435 SLP Stop Time (ACUTE ONLY): 1452 SLP Time Calculation (min) (ACUTE ONLY): 17 min  Past Medical History:  Past Medical History:  Diagnosis Date  . Arthritis   . Asthma   . Fibromyalgia   . Hypertension   . Migraines   . Pneumonia    In the 1980s   Past Surgical History:  Past Surgical History:  Procedure Laterality Date  . HERNIA REPAIR  1970   HPI:  Pt is a 71 y.o. female with PMH of obesity and hypertension who is had several weeks of nonproductive cough and decreased by mouth intake. Daughter noted signs of confusion at home and pt was brought to ER on 2/25. MD noted pt on a significant amount of sedating medications. CXR showed patchy multifocal opacities within the R upper/ lower lobes concerning for possible infiltrates, along with a hiatal hernia. Pt had PNA recently, in 10/2016. Bedside swallow eval ordered to assist in ruling out aspiration PNA.    Assessment / Plan / Recommendation Clinical Impression  Pt showed no overt s/s of aspiration across consistencies trialed. Pt had a cough at bedside prior to providing any POs, did not appear related to swallow function. Pt reported having hx of GERD managed by medication. Reviewed reflux precautions (sit upright, alternate food with liquid, small bites/ sips). Continue regular/ thin liquid diet with intermittent supervision to cue for reflux precautions. SLP will sign off at this time. Please re-consult if needs arise. SLP Visit Diagnosis: Dysphagia, unspecified (R13.10)    Aspiration Risk  Mild aspiration risk    Diet Recommendation Regular;Thin liquid   Liquid Administration via: Cup;Straw Medication Administration: Whole meds with liquid Supervision: Patient able to self feed;Intermittent supervision to cue for compensatory  strategies Compensations: Slow rate;Small sips/bites;Follow solids with liquid Postural Changes: Seated upright at 90 degrees;Remain upright for at least 30 minutes after po intake    Other  Recommendations Oral Care Recommendations: Oral care BID   Follow up Recommendations None      Frequency and Duration            Prognosis        Swallow Study   General HPI: Pt is a 71 y.o. female with PMH of obesity and hypertension who is had several weeks of nonproductive cough and decreased by mouth intake. Daughter noted signs of confusion at home and pt was brought to ER on 2/25. MD noted pt on a significant amount of sedating medications. CXR showed patchy multifocal opacities within the R upper/ lower lobes concerning for possible infiltrates, along with a hiatal hernia. Pt had PNA recently, in 10/2016. Bedside swallow eval ordered to assist in ruling out aspiration PNA.  Type of Study: Bedside Swallow Evaluation Previous Swallow Assessment: none in chart Diet Prior to this Study: Regular;Thin liquids Temperature Spikes Noted: No Respiratory Status: Room air History of Recent Intubation: No Behavior/Cognition: Alert;Cooperative;Pleasant mood Oral Cavity Assessment: Within Functional Limits Oral Care Completed by SLP: No Oral Cavity - Dentition: Adequate natural dentition Vision: Functional for self-feeding Self-Feeding Abilities: Able to feed self Patient Positioning: Upright in bed Baseline Vocal Quality: Normal Volitional Cough: Strong Volitional Swallow: Able to elicit    Oral/Motor/Sensory Function Overall Oral Motor/Sensory Function: Within functional limits   Ice Chips Ice chips: Not tested   Thin Liquid Thin Liquid: Within functional limits Presentation: Cup;Straw  Nectar Thick Nectar Thick Liquid: Not tested   Honey Thick Honey Thick Liquid: Not tested   Puree Puree: Within functional limits Presentation: Self Fed;Spoon   Solid   GO   Solid: Within functional  limits Presentation: Self Patton Salles, Amy K, MA, CCC-SLP 12/31/2016,2:56 PM [phone]

## 2017-01-01 LAB — BASIC METABOLIC PANEL
Anion gap: 8 (ref 5–15)
BUN: 11 mg/dL (ref 6–20)
CALCIUM: 9 mg/dL (ref 8.9–10.3)
CO2: 19 mmol/L — ABNORMAL LOW (ref 22–32)
CREATININE: 0.91 mg/dL (ref 0.44–1.00)
Chloride: 109 mmol/L (ref 101–111)
GFR calc non Af Amer: >60 mL/min (ref >60)
Glucose, Bld: 90 mg/dL (ref 65–99)
Potassium: 3.6 mmol/L (ref 3.5–5.1)
SODIUM: 136 mmol/L (ref 135–145)

## 2017-01-01 LAB — HIV ANTIBODY (ROUTINE TESTING W REFLEX): HIV Screen 4th Generation wRfx: NONREACTIVE

## 2017-01-01 LAB — CBC
HCT: 23.7 % — ABNORMAL LOW (ref 36.0–46.0)
Hemoglobin: 7.2 g/dL — ABNORMAL LOW (ref 12.0–15.0)
MCH: 23.2 pg — ABNORMAL LOW (ref 26.0–34.0)
MCHC: 30.4 g/dL (ref 30.0–36.0)
MCV: 76.5 fL — ABNORMAL LOW (ref 78.0–100.0)
PLATELETS: 412 10*3/uL — AB (ref 150–400)
RBC: 3.1 MIL/uL — AB (ref 3.87–5.11)
RDW: 18.7 % — ABNORMAL HIGH (ref 11.5–15.5)
WBC: 5.8 10*3/uL (ref 4.0–10.5)

## 2017-01-01 LAB — URINE CULTURE: CULTURE: NO GROWTH

## 2017-01-01 MED ORDER — CLOTRIMAZOLE 1 % VA CREA
1.0000 | TOPICAL_CREAM | Freq: Every day | VAGINAL | Status: DC
  Administered 2017-01-01: 1 via VAGINAL
  Filled 2017-01-01: qty 45

## 2017-01-01 MED ORDER — HYDROCODONE-ACETAMINOPHEN 5-325 MG PO TABS
1.0000 | ORAL_TABLET | Freq: Four times a day (QID) | ORAL | Status: DC | PRN
  Administered 2017-01-01 (×4): 2 via ORAL
  Administered 2017-01-02 (×2): 1 via ORAL
  Filled 2017-01-01 (×3): qty 2
  Filled 2017-01-01: qty 1
  Filled 2017-01-01 (×2): qty 2

## 2017-01-01 MED ORDER — GUAIFENESIN ER 600 MG PO TB12
1200.0000 mg | ORAL_TABLET | Freq: Two times a day (BID) | ORAL | Status: DC
  Administered 2017-01-01 – 2017-01-02 (×3): 1200 mg via ORAL
  Filled 2017-01-01 (×3): qty 2

## 2017-01-01 MED ORDER — HYDROCORTISONE 0.5 % EX CREA
TOPICAL_CREAM | Freq: Two times a day (BID) | CUTANEOUS | Status: DC | PRN
  Administered 2017-01-01: 21:00:00 via TOPICAL
  Filled 2017-01-01: qty 28.35

## 2017-01-01 NOTE — Progress Notes (Addendum)
On call notified about high BP, see flowsheet. Will continue to monitor and notify if >180, per verbal order. Likely needs adjustment with home medications.

## 2017-01-01 NOTE — Progress Notes (Signed)
PROGRESS NOTE    Shirley Schneider  [id number] DOB: 1946/08/25 DOA: 12/31/2016 PCP: Philis Fendt, MD   Brief Narrative: Shirley Schneider is a 71 y.o. female with a history of obesity, hypertension, asthma. She presented with nonproductive cough and was found to have pneumonia. She was previously treated with Augmentin as an outpatient. She is currently on ceftriaxone and azithromycin.   Assessment & Plan:   Active Problems:   Essential hypertension   CAP (community acquired pneumonia)   Community acquired pneumonia Previously treated with Augmentin. Now on ceftriaxone and azithromycin. Productive cough improved slightly. Afebrile. Speech evaluation significant for mild aspiration risk. -continue ceftriaxone and azithromycin -flutter valve -incentive spirometry -guaifenesin -PT eval  Asthma -continue Dulera -continue albuterol  Confusion Patient appears very oriented except with regard to the year (thought it was 2017). Possibly secondary to sedating medications. -discuss with daughter  Essential hypertension -continue amlodipine -continue hydrochlorothiazide  Prolonged AV conduction Chronic. Stable. Asymptomatic -avoid beta blockers  RBBB Chronic. Stable.  Obesity   DVT prophylaxis: Lovenox Code Status: Full code Family Communication: None at bedside Disposition Plan: Discharge likely tomorrow   Consultants:   None  Procedures:  None  Antimicrobials:  Ceftriaxone  Azithromycin    Subjective: Patient states she has some coughing with sputum production. No chest pain or dyspnea.  Objective: Vitals:   12/31/16 2232 12/31/16 2238 01/01/17 0439 01/01/17 0846  BP: 121/69  139/82 (!) 151/99  Pulse: 63  84   Resp: 16  18   Temp: 98.4 F (36.9 C)  98.6 F (37 C)   TempSrc: Oral  Oral   SpO2: 100% 98% 99%   Weight:      Height:        Intake/Output Summary (Last 24 hours) at 01/01/17 0908 Last data filed at 01/01/17 0656  Gross per  24 hour  Intake              600 ml  Output             3350 ml  Net            -2750 ml   Filed Weights   12/31/16 0219 12/31/16 0837  Weight: 88.5 kg (195 lb) 87.5 kg (193 lb)    Examination:  General exam: Appears calm and comfortable Respiratory system: Clear to auscultation. Slightly diminished. Respiratory effort normal. Cardiovascular system: S1 & S2 heard, RRR. No murmurs Gastrointestinal system: Abdomen is nondistended, soft and nontender. Normal bowel sounds heard. Central nervous system: Alert and oriented to person and place. Oriented to date but not year. No focal neurological deficits. Extremities: No edema. No calf tenderness Skin: No cyanosis. No rashes Psychiatry: Judgement and insight appear normal. Mood & affect appropriate.     Data Reviewed: I have personally reviewed following labs and imaging studies  CBC:  Recent Labs Lab 12/31/16 0239 01/01/17 0414  WBC 7.6 5.8  NEUTROABS 5.3  --   HGB 8.0* 7.2*  HCT 26.6* 23.7*  MCV 76.9* 76.5*  PLT 473* 123456*   Basic Metabolic Panel:  Recent Labs Lab 12/31/16 0239 01/01/17 0414  NA 136 136  K 3.8 3.6  CL 108 109  CO2 20* 19*  GLUCOSE 92 90  BUN 13 11  CREATININE 0.98 0.91  CALCIUM 9.6 9.0   GFR: Estimated Creatinine Clearance: 63.7 mL/min (by C-G formula based on SCr of 0.91 mg/dL). Liver Function Tests:  Recent Labs Lab 12/31/16 0239  AST 14*  ALT 9*  ALKPHOS 87  BILITOT 0.1*  PROT 8.2*  ALBUMIN 4.6   No results for input(s): LIPASE, AMYLASE in the last 168 hours. No results for input(s): AMMONIA in the last 168 hours. Coagulation Profile: No results for input(s): INR, PROTIME in the last 168 hours. Cardiac Enzymes: No results for input(s): CKTOTAL, CKMB, CKMBINDEX, TROPONINI in the last 168 hours. BNP (last 3 results) No results for input(s): PROBNP in the last 8760 hours. HbA1C: No results for input(s): HGBA1C in the last 72 hours. CBG:  Recent Labs Lab 12/31/16 0259    GLUCAP 83   Lipid Profile: No results for input(s): CHOL, HDL, LDLCALC, TRIG, CHOLHDL, LDLDIRECT in the last 72 hours. Thyroid Function Tests: No results for input(s): TSH, T4TOTAL, FREET4, T3FREE, THYROIDAB in the last 72 hours. Anemia Panel: No results for input(s): VITAMINB12, FOLATE, FERRITIN, TIBC, IRON, RETICCTPCT in the last 72 hours. Sepsis Labs:  Recent Labs Lab 12/31/16 0405 12/31/16 0900  LATICACIDVEN 0.9 0.9    No results found for this or any previous visit (from the past 240 hour(s)).       Radiology Studies: Dg Chest 2 View  Result Date: 12/31/2016 CLINICAL DATA:  Initial evaluation for acute cough, confusion. EXAM: CHEST  2 VIEW COMPARISON:  Prior radiograph from 11/01/2016. FINDINGS: Stable cardiomegaly. Mediastinal silhouette within normal limits. Hiatal hernia noted. Lungs are hypoinflated. Patchy multifocal opacities within the right upper and lower lobes, concerning for possible infiltrate. Left lung grossly clear. No pulmonary edema. No pleural effusion. No acute osseus abnormality. Degenerative changes noted about the shoulders. IMPRESSION: 1. Patchy multifocal opacities within the right upper and lower lobes, concerning for possible infiltrates. 2. Hiatal hernia. Electronically Signed   By: Jeannine Boga M.D.   On: 12/31/2016 06:04        Scheduled Meds: . amLODipine  5 mg Oral Daily  . azithromycin  500 mg Intravenous Q24H  . cefTRIAXone (ROCEPHIN)  IV  1 g Intravenous Q24H  . clotrimazole  1 Applicatorful Vaginal QHS  . darifenacin  7.5 mg Oral Daily  . diclofenac sodium  1 application Topical QID  . enoxaparin (LOVENOX) injection  40 mg Subcutaneous Q24H  . feeding supplement (ENSURE ENLIVE)  237 mL Oral BID BM  . hydrochlorothiazide  25 mg Oral Daily  . mometasone-formoterol  2 puff Inhalation BID  . pantoprazole  40 mg Oral Daily   Continuous Infusions: . sodium chloride 75 mL/hr at 12/31/16 1145     LOS: 1 day     Cordelia Poche [facility] 01/01/2017, 9:08 AM Pager: [phone]  If 7PM-7AM, please contact night-coverage [email or web address] Password [email or web address] 01/01/2017, 9:08 AM

## 2017-01-02 ENCOUNTER — Encounter (HOSPITAL_COMMUNITY): Payer: Self-pay

## 2017-01-02 DIAGNOSIS — I4510 Unspecified right bundle-branch block: Secondary | ICD-10-CM

## 2017-01-02 DIAGNOSIS — I10 Essential (primary) hypertension: Secondary | ICD-10-CM

## 2017-01-02 DIAGNOSIS — J45909 Unspecified asthma, uncomplicated: Secondary | ICD-10-CM

## 2017-01-02 DIAGNOSIS — I440 Atrioventricular block, first degree: Secondary | ICD-10-CM

## 2017-01-02 DIAGNOSIS — J181 Lobar pneumonia, unspecified organism: Secondary | ICD-10-CM

## 2017-01-02 DIAGNOSIS — E669 Obesity, unspecified: Secondary | ICD-10-CM

## 2017-01-02 LAB — IRON AND TIBC
Iron: 14 ug/dL — ABNORMAL LOW (ref 28–170)
Saturation Ratios: 3 % — ABNORMAL LOW (ref 10.4–31.8)
TIBC: 424 ug/dL (ref 250–450)
UIBC: 410 ug/dL

## 2017-01-02 LAB — HEMOGLOBIN AND HEMATOCRIT, BLOOD
HEMATOCRIT: 26 % — AB (ref 36.0–46.0)
HEMOGLOBIN: 7.7 g/dL — AB (ref 12.0–15.0)

## 2017-01-02 LAB — FERRITIN: Ferritin: 7 ng/mL — ABNORMAL LOW (ref 11–307)

## 2017-01-02 MED ORDER — ENSURE ENLIVE PO LIQD: 237.0000 mL | Freq: Two times a day (BID) | ORAL | 0 refills | Status: DC

## 2017-01-02 MED ORDER — CEFPODOXIME PROXETIL 200 MG PO TABS: 200.0000 mg | ORAL_TABLET | Freq: Two times a day (BID) | ORAL | 0 refills | Status: AC

## 2017-01-02 MED ORDER — GUAIFENESIN ER 600 MG PO TB12: 1200.0000 mg | ORAL_TABLET | Freq: Two times a day (BID) | ORAL | 0 refills | Status: DC | PRN

## 2017-01-02 MED ORDER — CEFPODOXIME PROXETIL 200 MG PO TABS: 200.0000 mg | ORAL_TABLET | Freq: Two times a day (BID) | ORAL | Status: DC

## 2017-01-02 MED ORDER — AZITHROMYCIN 250 MG PO TABS
500.0000 mg | ORAL_TABLET | Freq: Every day | ORAL | Status: DC
  Administered 2017-01-02: 500 mg via ORAL
  Filled 2017-01-02: qty 2

## 2017-01-02 MED ORDER — POLYETHYLENE GLYCOL 3350 17 G PO PACK: 17.0000 g | PACK | Freq: Every day | ORAL | 0 refills | Status: DC | PRN

## 2017-01-02 MED ORDER — AZITHROMYCIN 250 MG PO TABS: 250.0000 mg | ORAL_TABLET | Freq: Every day | ORAL | 0 refills | Status: AC

## 2017-01-02 NOTE — Progress Notes (Signed)
Nutrition Brief Note  Patient identified on the Malnutrition Screening Tool (MST) Report  Pt currently eating 75-100% of meals. Pt with some weight loss over the past 2 years but based on weight from 2009, weight has overall increased.  Wt Readings from Last 15 Encounters:  12/31/16 193 lb (87.5 kg)  11/01/16 195 lb (88.5 kg)  03/03/15 242 lb (109.8 kg)  09/22/14 236 lb (107 kg)  07/01/14 234 lb (106.1 kg)  04/14/14 220 lb 7.4 oz (100 kg)  08/12/08 197 lb 2.1 oz (89.4 kg)  05/19/08 181 lb 6.1 oz (82.3 kg)  05/01/08 180 lb 12.8 oz (82 kg)  04/29/08 175 lb 12.8 oz (79.7 kg)    Body mass index is 32.12 kg/m. Patient meets criteria for obesity based on current BMI.   Current diet order is 2GMNA, patient is consuming approximately 100% of meals at this time. Labs and medications reviewed.   No nutrition interventions warranted at this time. If nutrition issues arise, please consult RD.   Clayton Bibles, MS, RD, LDN Pager: [phone] After Hours Pager: [phone]

## 2017-01-02 NOTE — Discharge Instructions (Addendum)
Shirley Schneider,  You were admitted because you have pneumonia. You have been treated with IV antibiotics which has improved his symptoms. He will go home on comparable medications to continue treatment as an outpatient. Physical therapy recommended home health physical therapy which has been ordered for you. Please follow up with her primary care physician within one week. Is a pleasure taking care of you, Shirley Schneider.  Sincerely, Cordelia Poche, M.D. [facility].

## 2017-01-02 NOTE — Progress Notes (Signed)
PHARMACIST - PHYSICIAN COMMUNICATION DR:   Lonny Prude CONCERNING: Antibiotic IV to Oral Route Change Policy  RECOMMENDATION: This patient is receiving Zithromax by the intravenous route.  Based on criteria approved by the Pharmacy and Therapeutics Committee, the antibiotic(s) is/are being converted to the equivalent oral dose form(s).   DESCRIPTION: These criteria include:  Patient being treated for a respiratory tract infection, urinary tract infection, cellulitis or clostridium difficile associated diarrhea if on metronidazole  The patient is not neutropenic and does not exhibit a GI malabsorption state  The patient is eating (either orally or via tube) and/or has been taking other orally administered medications for a least 24 hours  The patient is improving clinically and has a Tmax < 100.5  If you have questions about this conversion, please contact the Pharmacy Department  []   [phone] )  [facility] []   [phone] )  [facility] []   [phone] )  [facility] []   [phone] )  [facility] [x]   [phone] )  [place], PharmD, [place] Pager [phone] 01/02/2017 8:28 AM

## 2017-01-02 NOTE — Progress Notes (Signed)
PT recommendations gone over with pt at bedside. Pt politely declines home health PT services. No other CM needs communicated. Marney Doctor RN,BSN,NCM [phone]

## 2017-01-02 NOTE — Progress Notes (Signed)
Discharge instructions reviewed with patient. Patient verbalized understanding. Patient to be discharged via private vehicle with daughter.  

## 2017-01-02 NOTE — Discharge Summary (Signed)
Physician Discharge Summary  Shirley Schneider [id number] DOB: 1946/08/21 DOA: 12/31/2016  PCP: Philis Fendt, MD  Admit date: 12/31/2016 Discharge date: 01/02/2017  Admitted From: Home Disposition: Home  Recommendations for Outpatient Follow-up:  1. Follow up with PCP in 1 week 2. Repeat chest x-ray in 3-4 weeks 3. Please follow up on the following pending results: Blood culture  Home Health: Physical therapy  Discharge Condition: Stable CODE STATUS: Full code    Brief/Interim Summary:  Admission HPI written by Gevena Barre, MD   Chief Complaint: Cough and confusion  HPI: Shirley Schneider is a 71 y.o. female with medical history significant of obesity, hypertension who is had several weeks of nonproductive cough and decreased by mouth intake. She was seen by her PCP and started on Augmentin almost 2 weeks ago. This is not changed her symptoms. Patient was at home with her daughter is quite independently functioning. Daughter became concerned when patient started to show signs of confusion and so brought the patient into the emergency room.  ED Course: In the emergency room, patient noted to have a normal white count and normal lactic acid level, and CT scan of the head was unremarkable. A chest x-ray noted multilobar pneumonia of the right lung. On exam, patient was found to be alert and oriented 2, but did not know what year it was. Hospitalists were called for further evaluation    Hospital course:  Community acquired pneumonia Previously treated with Augmentin. Now on ceftriaxone and azithromycin. Did not require oxygen. Speech evaluation significant for mild aspiration risk. Ceftriaxone transition to [facility] before discharge. Continue azithromycin. PT recommended home health physical therapy.  Asthma Stable. Continued Dulera and albuterol  Confusion Per her daughter's report. The patient was for the most part oriented except for the year. She is oriented fully  at discharge.  Essential hypertension Continue the amlodipine and hydrochlorothiazide.  Prolonged AV conduction Chronic. Stable. Asymptomatic. Avoid beta blockers as an outpatient.  RBBB Chronic. Stable.  Obesity BMI 32.2  Discharge Diagnoses:  Principal Problem:   CAP (community acquired pneumonia) Active Problems:   Essential hypertension   Asthma   Prolonged PR interval   RBBB   Obesity (BMI 30.0-34.9)    Discharge Instructions   Allergies as of 01/02/2017      Reactions   Aspirin    upstomach      Medication List    STOP taking these medications   methocarbamol 750 MG tablet Commonly known as:  ROBAXIN     TAKE these medications   albuterol (2.5 MG/3ML) 0.083% nebulizer solution Commonly known as:  PROVENTIL Take 2.5 mg by nebulization every 6 (six) hours as needed for wheezing or shortness of breath.   albuterol 108 (90 Base) MCG/ACT inhaler Commonly known as:  PROVENTIL HFA;VENTOLIN HFA Inhale 2 puffs into the lungs every 6 (six) hours as needed for wheezing.   amLODipine 5 MG tablet Commonly known as:  NORVASC Take 5 mg by mouth daily.   azithromycin 250 MG tablet Commonly known as:  ZITHROMAX Take 1 tablet (250 mg total) by mouth daily. Start taking on:  01/03/2017   budesonide-formoterol 160-4.5 MCG/ACT inhaler Commonly known as:  SYMBICORT Inhale 2 puffs into the lungs 2 (two) times daily.   butalbital-acetaminophen-caffeine 50-325-40 MG tablet Commonly known as:  FIORICET, ESGIC Take 1 tablet by mouth 2 (two) times daily as needed for headache.   cefpodoxime 200 MG tablet Commonly known as:  VANTIN Take 1 tablet (200 mg total) by mouth  2 (two) times daily. Start taking on:  01/03/2017   dextromethorphan-guaiFENesin 30-600 MG 12hr tablet Commonly known as:  MUCINEX DM Take 1 tablet by mouth 2 (two) times daily.   diclofenac sodium 1 % Gel Commonly known as:  VOLTAREN Apply 1 application topically 4 (four) times daily. Apply to  knees   feeding supplement (ENSURE ENLIVE) Liqd Take 237 mLs by mouth 2 (two) times daily between meals.   guaiFENesin 600 MG 12 hr tablet Commonly known as:  MUCINEX Take 2 tablets (1,200 mg total) by mouth 2 (two) times daily as needed.   hydrochlorothiazide 25 MG tablet Commonly known as:  HYDRODIURIL Take 25 mg by mouth daily.   HYDROcodone-acetaminophen 10-325 MG tablet Commonly known as:  NORCO Take 1 tablet by mouth every 6 (six) hours as needed.   levalbuterol 45 MCG/ACT inhaler Commonly known as:  XOPENEX HFA Inhale 2 puffs into the lungs every 6 (six) hours as needed.   LORazepam 1 MG tablet Commonly known as:  ATIVAN Take 1 mg by mouth daily as needed for anxiety.   omeprazole 40 MG capsule Commonly known as:  PRILOSEC TAKE 1 CAPSULE (40 MG TOTAL) BY MOUTH 2 (TWO) TIMES DAILY.   ondansetron 4 MG disintegrating tablet Commonly known as:  ZOFRAN-ODT Take 1 tablet by mouth 2 (two) times daily.   polyethylene glycol packet Commonly known as:  MIRALAX / GLYCOLAX Take 17 g by mouth daily as needed for mild constipation.   tiZANidine 4 MG tablet Commonly known as:  ZANAFLEX Take 4 mg by mouth at bedtime.   traZODone 100 MG tablet Commonly known as:  DESYREL Take 100 mg by mouth at bedtime as needed. for sleep   VESICARE 5 MG tablet Generic drug:  solifenacin Take 5 mg by mouth daily.      Follow-up Information    Philis Fendt, MD. Schedule an appointment as soon as possible for a visit in 1 week(s).   Specialty:  Internal Medicine Contact information: [place] [address] [phone]          Allergies  Allergen Reactions  . Aspirin     upstomach    Consultations:  None   Procedures/Studies: Dg Chest 2 View  Result Date: 12/31/2016 CLINICAL DATA:  Initial evaluation for acute cough, confusion. EXAM: CHEST  2 VIEW COMPARISON:  Prior radiograph from 11/01/2016. FINDINGS: Stable cardiomegaly. Mediastinal silhouette  within normal limits. Hiatal hernia noted. Lungs are hypoinflated. Patchy multifocal opacities within the right upper and lower lobes, concerning for possible infiltrate. Left lung grossly clear. No pulmonary edema. No pleural effusion. No acute osseus abnormality. Degenerative changes noted about the shoulders. IMPRESSION: 1. Patchy multifocal opacities within the right upper and lower lobes, concerning for possible infiltrates. 2. Hiatal hernia. Electronically Signed   By: Jeannine Boga M.D.   On: 12/31/2016 06:04      Subjective: History reports some productive cough. Improvement of symptoms. No chest pain or dyspnea.  Discharge Exam: Vitals:   01/02/17 0207 01/02/17 0620  BP: (!) 168/93 (!) 147/80  Pulse:  71  Resp:  16  Temp:  98.4 F (36.9 C)   Vitals:   01/01/17 2151 01/02/17 0207 01/02/17 0620 01/02/17 0954  BP: (!) 174/104 (!) 168/93 (!) 147/80   Pulse: 66  71   Resp: 14  16   Temp: 99 F (37.2 C)  98.4 F (36.9 C)   TempSrc: Oral  Oral   SpO2: 99%  100% 96%  Weight:  Height:        General: Pt is alert, awake, not in acute distress Cardiovascular: RRR, S1/S2 +, no rubs, no gallops Respiratory: CTA bilaterally, no wheezing, no rhonchi Abdominal: Soft, NT, ND, bowel sounds + Extremities: no edema, no cyanosis    The results of significant diagnostics from this hospitalization (including imaging, microbiology, ancillary and laboratory) are listed below for reference.     Microbiology: Recent Results (from the past 240 hour(s))  Urine culture     Status: None   Collection Time: 12/31/16  3:36 AM  Result Value Ref Range Status   Specimen Description URINE, RANDOM  Final   Special Requests NONE  Final   Culture   Final    NO GROWTH Performed at [facility], [address], [place], [place] [address]    Report Status 01/01/2017 FINAL  Final  Culture, blood (routine x 2) Call MD if unable to obtain prior to antibiotics being given     Status:  None (Preliminary result)   Collection Time: 12/31/16  8:59 AM  Result Value Ref Range Status   Specimen Description BLOOD RIGHT ANTECUBITAL  Final   Special Requests BOTTLES DRAWN AEROBIC AND ANAEROBIC 5CC  Final   Culture   Final    NO GROWTH 2 DAYS Performed at [facility], [place] [address], [place], [place] [address]    Report Status PENDING  Incomplete  Culture, blood (routine x 2) Call MD if unable to obtain prior to antibiotics being given     Status: None (Preliminary result)   Collection Time: 12/31/16  9:01 AM  Result Value Ref Range Status   Specimen Description BLOOD RIGHT HAND  Final   Special Requests IN PEDIATRIC BOTTLE 3CC  Final   Culture   Final    NO GROWTH 2 DAYS Performed at [facility], [place] [address], [place], [place] [address]    Report Status PENDING  Incomplete     Labs: BNP (last 3 results) No results for input(s): BNP in the last 8760 hours. Basic Metabolic Panel:  Recent Labs Lab 12/31/16 0239 01/01/17 0414  NA 136 136  K 3.8 3.6  CL 108 109  CO2 20* 19*  GLUCOSE 92 90  BUN 13 11  CREATININE 0.98 0.91  CALCIUM 9.6 9.0   Liver Function Tests:  Recent Labs Lab 12/31/16 0239  AST 14*  ALT 9*  ALKPHOS 87  BILITOT 0.1*  PROT 8.2*  ALBUMIN 4.6   CBC:  Recent Labs Lab 12/31/16 0239 01/01/17 0414  WBC 7.6 5.8  NEUTROABS 5.3  --   HGB 8.0* 7.2*  HCT 26.6* 23.7*  MCV 76.9* 76.5*  PLT 473* 412*   CBG:  Recent Labs Lab 12/31/16 0259  GLUCAP 83   Urinalysis    Component Value Date/Time   COLORURINE YELLOW 12/31/2016 0336   APPEARANCEUR CLEAR 12/31/2016 0336   LABSPEC 1.010 12/31/2016 0336   PHURINE 5.0 12/31/2016 0336   GLUCOSEU NEGATIVE 12/31/2016 0336   HGBUR NEGATIVE 12/31/2016 0336   BILIRUBINUR NEGATIVE 12/31/2016 0336   KETONESUR NEGATIVE 12/31/2016 0336   PROTEINUR NEGATIVE 12/31/2016 0336   UROBILINOGEN 0.2 04/05/2014 1039   NITRITE NEGATIVE 12/31/2016 0336   LEUKOCYTESUR TRACE (A) 12/31/2016  0336   Microbiology Recent Results (from the past 240 hour(s))  Urine culture     Status: None   Collection Time: 12/31/16  3:36 AM  Result Value Ref Range Status   Specimen Description URINE, RANDOM  Final  Special Requests NONE  Final   Culture   Final    NO GROWTH Performed at [facility], [place] [address], [place], [place] [address]    Report Status 01/01/2017 FINAL  Final  Culture, blood (routine x 2) Call MD if unable to obtain prior to antibiotics being given     Status: None (Preliminary result)   Collection Time: 12/31/16  8:59 AM  Result Value Ref Range Status   Specimen Description BLOOD RIGHT ANTECUBITAL  Final   Special Requests BOTTLES DRAWN AEROBIC AND ANAEROBIC 5CC  Final   Culture   Final    NO GROWTH 2 DAYS Performed at [facility], [place] [address], [place], [place] [address]    Report Status PENDING  Incomplete  Culture, blood (routine x 2) Call MD if unable to obtain prior to antibiotics being given     Status: None (Preliminary result)   Collection Time: 12/31/16  9:01 AM  Result Value Ref Range Status   Specimen Description BLOOD RIGHT HAND  Final   Special Requests IN PEDIATRIC BOTTLE 3CC  Final   Culture   Final    NO GROWTH 2 DAYS Performed at [facility], [place] [address] [place],  [address]    Report Status PENDING  Incomplete     Time coordinating discharge: Over 30 minutes  SIGNED:   Cordelia Poche, MD [facility] 01/02/2017, 11:22 AM Pager [phone]  If 7PM-7AM, please contact night-coverage [email or web address] Password [email or web address]

## 2017-01-02 NOTE — Evaluation (Signed)
Physical Therapy Evaluation Patient Details Name: Shirley Schneider MRN: [id number] DOB: [birth year] Today's Date: 01/02/2017   History of Present Illness  Shirley Schneider is a 71 y.o. female with medical history significant of obesity, hypertension who is had several weeks of nonproductive cough and decreased by mouth intake, dx with CAP  Clinical Impression  Patient evaluated by Physical Therapy with no further acute PT needs identified. All education has been completed and the patient has no further questions. * See below for any follow-up Physical Therapy or equipment needs. PT is signing off. Thank you for this referral. encouraged pt to use her walker at home for safety; pt will benefit from HHPT, pt reports dtr is available to help as needed     Follow Up Recommendations Home health PT;Supervision/Assistance - 24 hour    Equipment Recommendations       Recommendations for Other Services       Precautions / Restrictions Precautions Precautions: Fall Restrictions Weight Bearing Restrictions: No      Mobility  Bed Mobility Overal bed mobility: Needs Assistance Bed Mobility: Supine to Sit     Supine to sit: Modified independent (Device/Increase time)        Transfers Overall transfer level: Needs assistance Equipment used: None Transfers: Sit to/from Stand Sit to Stand: Supervision         General transfer comment: for safety  Ambulation/Gait Ambulation/Gait assistance: Supervision;Min guard Ambulation Distance (Feet): 80 Feet Assistive device: None;1 person hand held assist Gait Pattern/deviations: Step-through pattern;Decreased stride length;Narrow base of support;Trunk flexed;Drifts right/left     General Gait Details: HHA for balance initially, min guard to close supervision for balance and safety with turns; generally unsteady but no overt LOB  Stairs            Wheelchair Mobility    Modified Rankin (Stroke Patients Only)       Balance  Overall balance assessment: Needs assistance   Sitting balance-Leahy Scale: Good Sitting balance - Comments: pt dons socks in sitting without assist     Standing balance-Leahy Scale: Fair Standing balance comment: static stand fair, mild postural sway             High level balance activites: Direction changes;Turns High Level Balance Comments: min/guard assist wtih above             Pertinent Vitals/Pain Pain Assessment: 0-10 Pain Score: 2  Pain Location: not specified Pain Descriptors / Indicators: Sore Pain Intervention(s): Monitored during session    Home Living Family/patient expects to be discharged to:: Private residence Living Arrangements: Children Available Help at Discharge: Family Type of Home: House Home Access: Level entry     Home Layout: One level Home Equipment: Environmental consultant - 2 wheels;Bedside commode      Prior Function Level of Independence: Independent;Independent with assistive device(s)         Comments: uses walker a times     Hand Dominance        Extremity/Trunk Assessment   Upper Extremity Assessment Upper Extremity Assessment: Generalized weakness    Lower Extremity Assessment Lower Extremity Assessment: Generalized weakness    Cervical / Trunk Assessment Cervical / Trunk Assessment: Kyphotic  Communication   Communication: No difficulties  Cognition Arousal/Alertness: Awake/alert Behavior During Therapy: WFL for tasks assessed/performed Overall Cognitive Status: Within Functional Limits for tasks assessed                 General Comments: pt appears to have very slight delay in processing although  no family present to determine baseline    General Comments      Exercises     Assessment/Plan    PT Assessment All further PT needs can be met in the next venue of care  PT Problem List         PT Treatment Interventions      PT Goals (Current goals can be found in the Care Plan section)  Acute Rehab PT  Goals PT Goal Formulation: All assessment and education complete, DC therapy    Frequency     Barriers to discharge        Co-evaluation               End of Session Equipment Utilized During Treatment: Gait belt Activity Tolerance: Patient limited by fatigue;Patient tolerated treatment well Patient left: in chair;with call bell/phone within reach;with chair alarm set   PT Visit Diagnosis: Unsteadiness on feet (R26.81)         Time: 1010-1021 PT Time Calculation (min) (ACUTE ONLY): 11 min   Charges:   PT Evaluation $PT Eval Low Complexity: 1 Procedure     PT G Codes:         Latravious Levitt 01-Feb-2017, 11:01 AM

## 2017-01-05 LAB — CULTURE, BLOOD (ROUTINE X 2)
CULTURE: NO GROWTH
Culture: NO GROWTH

## 2017-01-22 ENCOUNTER — Other Ambulatory Visit: Payer: Self-pay | Admitting: Gastroenterology

## 2017-01-23 ENCOUNTER — Other Ambulatory Visit: Payer: Self-pay | Admitting: Gastroenterology

## 2017-01-28 ENCOUNTER — Encounter (HOSPITAL_COMMUNITY): Payer: Self-pay | Admitting: Emergency Medicine

## 2017-01-28 ENCOUNTER — Emergency Department (HOSPITAL_COMMUNITY): Payer: Medicare HMO

## 2017-01-28 ENCOUNTER — Emergency Department (HOSPITAL_COMMUNITY)
Admission: EM | Admit: 2017-01-28 | Discharge: 2017-01-28 | Disposition: A | Discharge status: Home / Self Care | Payer: Medicare HMO | Attending: Emergency Medicine | Admitting: Emergency Medicine

## 2017-01-28 DIAGNOSIS — R05 Cough: Secondary | ICD-10-CM

## 2017-01-28 DIAGNOSIS — Z79899 Other long term (current) drug therapy: Secondary | ICD-10-CM | POA: Insufficient documentation

## 2017-01-28 DIAGNOSIS — R112 Nausea with vomiting, unspecified: Secondary | ICD-10-CM | POA: Diagnosis not present

## 2017-01-28 DIAGNOSIS — I110 Hypertensive heart disease with heart failure: Secondary | ICD-10-CM | POA: Diagnosis not present

## 2017-01-28 DIAGNOSIS — I509 Heart failure, unspecified: Secondary | ICD-10-CM | POA: Insufficient documentation

## 2017-01-28 DIAGNOSIS — J45909 Unspecified asthma, uncomplicated: Secondary | ICD-10-CM | POA: Insufficient documentation

## 2017-01-28 DIAGNOSIS — R059 Cough, unspecified: Secondary | ICD-10-CM

## 2017-01-28 LAB — COMPREHENSIVE METABOLIC PANEL
ALT: 13 U/L — AB (ref 14–54)
ANION GAP: 15 (ref 5–15)
AST: 21 U/L (ref 15–41)
Albumin: 4.7 g/dL (ref 3.5–5.0)
Alkaline Phosphatase: 139 U/L — ABNORMAL HIGH (ref 38–126)
BUN: 21 mg/dL — ABNORMAL HIGH (ref 6–20)
CALCIUM: 10.4 mg/dL — AB (ref 8.9–10.3)
CO2: 23 mmol/L (ref 22–32)
Chloride: 100 mmol/L — ABNORMAL LOW (ref 101–111)
Creatinine, Ser: 1.26 mg/dL — ABNORMAL HIGH (ref 0.44–1.00)
GFR, EST AFRICAN AMERICAN: 49 mL/min — AB (ref >60)
GFR, EST NON AFRICAN AMERICAN: 42 mL/min — AB (ref >60)
Glucose, Bld: 116 mg/dL — ABNORMAL HIGH (ref 65–99)
POTASSIUM: 2.9 mmol/L — AB (ref 3.5–5.1)
SODIUM: 138 mmol/L (ref 135–145)
TOTAL PROTEIN: 9.8 g/dL — AB (ref 6.5–8.1)
Total Bilirubin: 0.8 mg/dL (ref 0.3–1.2)

## 2017-01-28 LAB — CBC WITH DIFFERENTIAL/PLATELET
Basophils Absolute: 0 10*3/uL (ref 0.0–0.1)
Basophils Relative: 0 %
EOS ABS: 0 10*3/uL (ref 0.0–0.7)
EOS PCT: 0 %
HCT: 34.2 % — ABNORMAL LOW (ref 36.0–46.0)
Hemoglobin: 10.9 g/dL — ABNORMAL LOW (ref 12.0–15.0)
Lymphocytes Relative: 7 %
Lymphs Abs: 1.1 10*3/uL (ref 0.7–4.0)
MCH: 23.3 pg — ABNORMAL LOW (ref 26.0–34.0)
MCHC: 31.9 g/dL (ref 30.0–36.0)
MCV: 73.2 fL — ABNORMAL LOW (ref 78.0–100.0)
Monocytes Absolute: 1.5 10*3/uL — ABNORMAL HIGH (ref 0.1–1.0)
Monocytes Relative: 10 %
Neutro Abs: 13.3 10*3/uL — ABNORMAL HIGH (ref 1.7–7.7)
Neutrophils Relative %: 83 %
PLATELETS: 717 10*3/uL — AB (ref 150–400)
RBC: 4.67 MIL/uL (ref 3.87–5.11)
RDW: 17.8 % — ABNORMAL HIGH (ref 11.5–15.5)
WBC: 16 10*3/uL — AB (ref 4.0–10.5)

## 2017-01-28 LAB — URINALYSIS, ROUTINE W REFLEX MICROSCOPIC
Bacteria, UA: NONE SEEN
Bilirubin Urine: NEGATIVE
Glucose, UA: NEGATIVE mg/dL
Ketones, ur: 20 mg/dL — AB
Nitrite: NEGATIVE
Protein, ur: >=300 mg/dL — AB
SPECIFIC GRAVITY, URINE: 1.018 (ref 1.005–1.030)
pH: 5 (ref 5.0–8.0)

## 2017-01-28 LAB — LIPASE, BLOOD: Lipase: 16 U/L (ref 11–51)

## 2017-01-28 LAB — I-STAT CG4 LACTIC ACID, ED: LACTIC ACID, VENOUS: 1.61 mmol/L (ref 0.5–1.9)

## 2017-01-28 LAB — TROPONIN I: TROPONIN I: 0.21 ng/mL — AB (ref <0.03)

## 2017-01-28 MED ORDER — SODIUM CHLORIDE 0.9 % IV BOLUS (SEPSIS)
1000.0000 mL | Freq: Once | INTRAVENOUS | Status: AC
  Administered 2017-01-28: 1000 mL via INTRAVENOUS

## 2017-01-28 MED ORDER — ONDANSETRON HCL 4 MG PO TABS: 4.0000 mg | ORAL_TABLET | Freq: Three times a day (TID) | ORAL | 0 refills | Status: DC | PRN

## 2017-01-28 MED ORDER — POTASSIUM CHLORIDE CRYS ER 20 MEQ PO TBCR
40.0000 meq | EXTENDED_RELEASE_TABLET | Freq: Once | ORAL | Status: AC
  Administered 2017-01-28: 40 meq via ORAL
  Filled 2017-01-28: qty 2

## 2017-01-28 MED ORDER — ONDANSETRON HCL 4 MG/2ML IJ SOLN
4.0000 mg | Freq: Once | INTRAMUSCULAR | Status: AC
  Administered 2017-01-28: 4 mg via INTRAVENOUS
  Filled 2017-01-28: qty 2

## 2017-01-28 MED ORDER — ONDANSETRON HCL 4 MG PO TABS: 4.0000 mg | ORAL_TABLET | Freq: Three times a day (TID) | ORAL | 0 refills | Status: AC | PRN

## 2017-01-28 NOTE — ED Notes (Signed)
ED Provider at bedside. 

## 2017-01-28 NOTE — ED Notes (Signed)
Bed: UE59 Expected date:  Expected time:  Means of arrival:  Comments: 71 yo abd pain

## 2017-01-28 NOTE — ED Provider Notes (Signed)
[facility] DEPT Provider Note   CSN: [id number] Arrival date & time: 01/28/17  1237     History   Chief Complaint Chief Complaint  Patient presents with  . Nausea  . Emesis    HPI Shirley Schneider is a 71 y.o. female with a past medical history significant for CHF, fibromyalgia, hypertension, asthma, and recent pneumonia with discharge one month ago who presents with a 2 day history of nausea, vomiting, decreased oral intake, and persistent cough. Patient is accompanied by her family who reports that the patient had been doing well since her discharge aside from a mild persistent cough. Patient has had some production with a clear sputum. Patient denies chest pain but says she has had some shortness of breath with her coughing fits. Patient denies lightheadedness, palpitations, or syncope. Patient does say that for the last 2 days, she has had nausea and vomiting. She denied abdominal pain, constipation, or diarrhea. She denies any urinary symptoms. She is unsure of sick contacts. She says that she has not been able to tolerate food or fluids aside from some small amounts of Pedialyte. She denies any lower extremity pain, swelling, or any other discomfort. She denies headache or neck pain. Patient's primary complaint on arrival is the nausea and vomiting. She reports her emesis has been nonbloody and nonbilious.    The history is provided by the patient, medical records and a relative. No language interpreter was used.  Emesis   This is a new problem. The current episode started 2 days ago. The problem occurs continuously. The problem has been gradually improving. The emesis has an appearance of stomach contents. There has been no fever. Associated symptoms include cough. Pertinent negatives include no abdominal pain, no arthralgias, no chills, no diarrhea, no fever, no headaches and no URI.    Past Medical History:  Diagnosis Date  . Arthritis   . Asthma   . Congestive heart  failure (CHF) ([place])   . Fibromyalgia   . Hypertension   . Migraines   . Pneumonia    In the 1980s    Patient Active Problem List   Diagnosis Date Noted  . Asthma 01/02/2017  . 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 RBBB 01/02/2017  . Obesity (BMI 30.0-34.9) 01/02/2017  . Hiatal hernia 07/01/2014  . Acute on chronic respiratory failure with hypoxia ([place]) 04/27/2014  . CAP (community acquired pneumonia) 04/27/2014  . Physical deconditioning 04/13/2014  . Acute respiratory failure with hypoxia ([place]) 04/05/2014  . Shock circulatory ([place]) 04/05/2014  . Congestive heart failure (CHF) ([place]) 04/04/2014  . PULMONARY INFILTRATES-PNEUMOPATHY 04/29/2008  . Essential hypertension 02/06/2008    Past Surgical History:  Procedure Laterality Date  . HERNIA REPAIR  1970    OB History    No data available       Home Medications    Prior to Admission medications   Medication Sig Start Date End Date Taking? Authorizing Provider  albuterol (PROVENTIL HFA;VENTOLIN HFA) 108 (90 BASE) MCG/ACT inhaler Inhale 2 puffs into the lungs every 6 (six) hours as needed for wheezing. 07/28/14   Brand Males, MD  albuterol (PROVENTIL) (2.5 MG/3ML) 0.083% nebulizer solution Take 2.5 mg by nebulization every 6 (six) hours as needed for wheezing or shortness of breath.    Historical Provider, MD  amLODipine (NORVASC) 5 MG tablet Take 5 mg by mouth daily. 12/07/16   Historical Provider, MD  budesonide-formoterol (SYMBICORT) 160-4.5 MCG/ACT inhaler Inhale 2 puffs into the lungs 2 (two) times daily.  Historical Provider, MD  butalbital-acetaminophen-caffeine (FIORICET, ESGIC) 50-325-40 MG per tablet Take 1 tablet by mouth 2 (two) times daily as needed for headache.  09/08/14   Historical Provider, MD  dextromethorphan-guaiFENesin (MUCINEX DM) 30-600 MG 12hr tablet Take 1 tablet by mouth 2 (two) times daily.    Historical Provider, MD  diclofenac sodium (VOLTAREN) 1 % GEL Apply 1 application topically 4 (four)  times daily. Apply to knees    Historical Provider, MD  feeding supplement, ENSURE ENLIVE, (ENSURE ENLIVE) LIQD Take 237 mLs by mouth 2 (two) times daily between meals. 01/02/17   Mariel Aloe, MD  guaiFENesin (MUCINEX) 600 MG 12 hr tablet Take 2 tablets (1,200 mg total) by mouth 2 (two) times daily as needed. 01/02/17   Mariel Aloe, MD  hydrochlorothiazide (HYDRODIURIL) 25 MG tablet Take 25 mg by mouth daily.    Historical Provider, MD  HYDROcodone-acetaminophen (NORCO) 10-325 MG tablet Take 1 tablet by mouth every 6 (six) hours as needed. 12/08/16   Historical Provider, MD  levalbuterol Penne Lash HFA) 45 MCG/ACT inhaler Inhale 2 puffs into the lungs every 6 (six) hours as needed. 12/07/16   Historical Provider, MD  LORazepam (ATIVAN) 1 MG tablet Take 1 mg by mouth daily as needed for anxiety.  08/25/14   Historical Provider, MD  omeprazole (PRILOSEC) 40 MG capsule TAKE 1 CAPSULE (40 MG TOTAL) BY MOUTH 2 (TWO) TIMES DAILY. 01/24/17   Milus Banister, MD  ondansetron (ZOFRAN-ODT) 4 MG disintegrating tablet Take 1 tablet by mouth 2 (two) times daily. 10/26/16   Historical Provider, MD  polyethylene glycol (MIRALAX / GLYCOLAX) packet Take 17 g by mouth daily as needed for mild constipation. 01/02/17   Mariel Aloe, MD  tiZANidine (ZANAFLEX) 4 MG tablet Take 4 mg by mouth at bedtime.    Historical Provider, MD  traZODone (DESYREL) 100 MG tablet Take 100 mg by mouth at bedtime as needed. for sleep 12/26/16   Historical Provider, MD  VESICARE 5 MG tablet Take 5 mg by mouth daily. 12/26/16   Historical Provider, MD    Family History Family History  Problem Relation Age of Onset  . Prostate cancer Father   . Diabetes Father   . Colon polyps Father   . Diabetes Mother   . Heart disease Maternal Grandfather   . Colon cancer Neg Hx   . Kidney disease Neg Hx   . Esophageal cancer Neg Hx     Social History Social History  Substance Use Topics  . Smoking status: Never Smoker  . Smokeless tobacco:  Never Used  . Alcohol use No     Allergies   Aspirin   Review of Systems Review of Systems  Constitutional: Negative for activity change, chills, diaphoresis, fatigue and fever.  HENT: Negative for congestion and rhinorrhea.   Eyes: Negative for visual disturbance.  Respiratory: Positive for cough. Negative for choking, chest tightness, shortness of breath, wheezing and stridor.   Cardiovascular: Negative for chest pain, palpitations and leg swelling.  Gastrointestinal: Positive for nausea and vomiting. Negative for abdominal distention, abdominal pain, constipation and diarrhea.  Genitourinary: Negative for difficulty urinating, dysuria, flank pain, frequency, hematuria, menstrual problem and pelvic pain.  Musculoskeletal: Negative for arthralgias, back pain and neck pain.  Skin: Negative for rash and wound.  Neurological: Negative for dizziness, weakness, light-headedness, numbness and headaches.  Psychiatric/Behavioral: Negative for agitation and confusion.  All other systems reviewed and are negative.    Physical Exam Updated Vital Signs BP (!) 176/115 (BP  Location: Left Arm)   Pulse 87   Temp 98.5 F (36.9 C) (Oral)   Resp 20   SpO2 99%   Physical Exam  Constitutional: She is oriented to person, place, and time. She appears well-developed and well-nourished. No distress.  HENT:  Head: Normocephalic and atraumatic.  Right Ear: External ear normal.  Left Ear: External ear normal.  Nose: Nose normal.  Mouth/Throat: Oropharynx is clear and moist. No oropharyngeal exudate.  Eyes: Conjunctivae and EOM are normal. Pupils are equal, round, and reactive to light.  Neck: Normal range of motion. Neck supple.  Cardiovascular: Normal rate, normal heart sounds and intact distal pulses.   No murmur heard. Pulmonary/Chest: Effort normal and breath sounds normal. No stridor. No respiratory distress. She has no wheezes. She has no rales. She exhibits no tenderness.  Abdominal:  Soft. Bowel sounds are normal. She exhibits no distension. There is no tenderness. There is no rebound.  Musculoskeletal: She exhibits no tenderness.  Neurological: She is alert and oriented to person, place, and time. She has normal reflexes. No cranial nerve deficit or sensory deficit. She exhibits normal muscle tone.  Skin: Skin is warm. Capillary refill takes less than 2 seconds. No rash noted. She is not diaphoretic. No erythema.  Psychiatric: She has a normal mood and affect.  Nursing note and vitals reviewed.    ED Treatments / Results  Labs (all labs ordered are listed, but only abnormal results are displayed) Labs Reviewed  CBC WITH DIFFERENTIAL/PLATELET - Abnormal; Notable for the following:       Result Value   WBC 16.0 (*)    Hemoglobin 10.9 (*)    HCT 34.2 (*)    MCV 73.2 (*)    MCH 23.3 (*)    RDW 17.8 (*)    Platelets 717 (*)    Neutro Abs 13.3 (*)    Monocytes Absolute 1.5 (*)    All other components within normal limits  COMPREHENSIVE METABOLIC PANEL - Abnormal; Notable for the following:    Potassium 2.9 (*)    Chloride 100 (*)    Glucose, Bld 116 (*)    BUN 21 (*)    Creatinine, Ser 1.26 (*)    Calcium 10.4 (*)    Total Protein 9.8 (*)    ALT 13 (*)    Alkaline Phosphatase 139 (*)    GFR calc non Af Amer 42 (*)    GFR calc Af Amer 49 (*)    All other components within normal limits  URINALYSIS, ROUTINE W REFLEX MICROSCOPIC - Abnormal; Notable for the following:    APPearance HAZY (*)    Hgb urine dipstick SMALL (*)    Ketones, ur 20 (*)    Protein, ur >=300 (*)    Leukocytes, UA TRACE (*)    Squamous Epithelial / LPF 6-30 (*)    All other components within normal limits  TROPONIN I - Abnormal; Notable for the following:    Troponin I 0.21 (*)    All other components within normal limits  URINE CULTURE  LIPASE, BLOOD  I-STAT CG4 LACTIC ACID, ED    EKG  EKG Interpretation  Date/Time:  Sunday January 28 2017 13:03:41 EDT Ventricular Rate:   83 PR Interval:    QRS Duration: 157 QT Interval:  418 QTC Calculation: 492 R Axis:   -61 Text Interpretation:  Sinus rhythm Prolonged PR interval Probable left atrial enlargement RBBB and LAFB LVH by voltage Borderline prolonged QT interval When compared to prior, greater  amplitude of T waves but similar directions.  No STEMI Confirmed by [facility] MD, [place] [id number]) on 01/28/2017 1:17:39 PM       Radiology Dg Chest 2 View  Result Date: 01/28/2017 CLINICAL DATA:  Hypertension with cough and shortness of breath EXAM: CHEST  2 VIEW COMPARISON:  December 31, 2016 FINDINGS: There is slight left base atelectasis. There is no edema or consolidation. Heart size and pulmonary vascularity are normal. No adenopathy. There is a hiatal hernia. There is degenerative change in the shoulders and degenerative change in the thoracic spine. IMPRESSION: No edema or consolidation. Slight left base atelectasis. Hiatal hernia present. Stable cardiac silhouette. Electronically Signed   By: Lowella Grip III M.D.   On: 01/28/2017 14:20    Procedures Procedures (including critical care time)  Medications Ordered in ED Medications  potassium chloride SA (K-DUR,KLOR-CON) CR tablet 40 mEq (not administered)  sodium chloride 0.9 % bolus 1,000 mL (0 mLs Intravenous Stopped 01/28/17 1619)  ondansetron (ZOFRAN) injection 4 mg (4 mg Intravenous Given 01/28/17 1413)     Initial Impression / Assessment and Plan / ED Course  I have reviewed the triage vital signs and the nursing notes.  Pertinent labs & imaging results that were available during my care of the patient were reviewed by me and considered in my medical decision making (see chart for details).     PARMINDER TRAPANI is a 71 y.o. female with a past medical history significant for CHF, fibromyalgia, hypertension, asthma, and recent pneumonia with discharge one month ago who presents with a 2 day history of nausea, vomiting, decreased oral intake, and  persistent cough.  History and exam are seen above.   On exam, patient's lungs are clear. Chest is nontender. Abdomen is nontender. No CVA tenderness. Lower extremities not edematous with no tenderness. No focal neurologic deficits.  Based on patient's symptoms, suspect a enteritis causing nausea and vomiting. With no abdominal tenderness, doubt appendicitis, cholecystitis, or obstruction. Patient said her last bowel movement was yesterday and was normal.  Patient given fluids due to likely dehydration with decreased oral intake. Patient also given nausea medications with improvement in nausea. Laboratory testing showed decrease in potassium, oral supplementation given. Creatinine slightly elevated from prior. Troponin elevated at 0.2 however, this is improved from prior. Given lack of chest pain, and no shortness of breath, do not feel patient has a cardiac etiology of her nausea and vomiting. Patient did have a leukocytosis of 16. This may be related to infection or dehydration as her also elevation of platelets significant for possible hemoconcentration. Lipase not elevated.   Chest x-ray shows no pneumonia, doubt recurrent pneumonia.  Patient is awaiting urinalysis to look for UTI. If patient has UTI, will treat. If patient does not, suspect a viral enteritis. Patient will likely be stable for discharge with nausea medications after rehydration and completion of lab testing.  Urinalysis shows no bacteria and no nitrites. They'll cells and leukocytes present, suspect contamination. Also ketones and protein.  Suspect dehydration. Patient felt much better after fluids. Patient able to tolerate crackers and ginger ale without difficulty. Patient will be given prescription for nausea medication to allow her to maintain hydration as an outpatient. Suspect a viral gastritis causing her symptoms. Patient instructed to follow-up with her PCP as well as observe strict return precautions. Patient and family  had no other questions and patient was discharged in good condition.   Final Clinical Impressions(s) / ED Diagnoses   Final diagnoses:  Non-intractable vomiting  with nausea, unspecified vomiting type  Cough     Clinical Impression: 1. Non-intractable vomiting with nausea, unspecified vomiting type   2. Cough     Disposition: Discharge  Condition: Good  I have discussed the results, Dx and Tx plan with the pt(& family if present). He/she/they expressed understanding and agree(s) with the plan. Discharge instructions discussed at great length. Strict return precautions discussed and pt &/or family have verbalized understanding of the instructions. No further questions at time of discharge.    New Prescriptions   ONDANSETRON (ZOFRAN) 4 MG TABLET    Take 1 tablet (4 mg total) by mouth every 8 (eight) hours as needed for nausea or vomiting.    Follow Up: Nolene Ebbs, MD [place] [address] [place] DEPT [place] [id number] [place] [phone]  If symptoms worsen     Courtney Paris, MD 01/29/17 1056

## 2017-01-28 NOTE — ED Triage Notes (Signed)
Pt c/o nausea and vomiting. Unable to take po and has not been able to take po meds.

## 2017-01-28 NOTE — Discharge Instructions (Signed)
Please take your nausea medicine as needed to help allow you to maintain hydration and eat. We suspect you have a viral gastritis causing your nausea and vomiting. Since you had no pain, we did not feel you needed imaging today however, if you began developing severe abdominal pain, abdominal distention, or are unable to have a bowel movement over the next few days, please return to the nearest emergency department. If anything acutely worsens, please return. Please schedule a follow-up appointment with your PCP for further management.

## 2017-01-28 NOTE — ED Notes (Signed)
Dr. Sherry Ruffing notified of troponin.

## 2017-01-30 LAB — URINE CULTURE: Culture: <10000 — AB

## 2017-03-02 ENCOUNTER — Other Ambulatory Visit: Payer: Self-pay | Admitting: Gastroenterology

## 2017-03-31 ENCOUNTER — Other Ambulatory Visit: Payer: Self-pay | Admitting: Gastroenterology

## 2017-05-21 ENCOUNTER — Emergency Department (HOSPITAL_COMMUNITY)
Admission: EM | Admit: 2017-05-21 | Discharge: 2017-05-22 | Disposition: A | Discharge status: Home / Self Care | Payer: Medicare HMO | Attending: Emergency Medicine | Admitting: Emergency Medicine

## 2017-05-21 DIAGNOSIS — I110 Hypertensive heart disease with heart failure: Secondary | ICD-10-CM | POA: Diagnosis not present

## 2017-05-21 DIAGNOSIS — I509 Heart failure, unspecified: Secondary | ICD-10-CM | POA: Diagnosis not present

## 2017-05-21 DIAGNOSIS — Z79899 Other long term (current) drug therapy: Secondary | ICD-10-CM | POA: Insufficient documentation

## 2017-05-21 DIAGNOSIS — T50901A Poisoning by unspecified drugs, medicaments and biological substances, accidental (unintentional), initial encounter: Secondary | ICD-10-CM | POA: Insufficient documentation

## 2017-05-21 DIAGNOSIS — J45909 Unspecified asthma, uncomplicated: Secondary | ICD-10-CM | POA: Diagnosis not present

## 2017-05-21 LAB — COMPREHENSIVE METABOLIC PANEL
ALBUMIN: 3.3 g/dL — AB (ref 3.5–5.0)
ALK PHOS: 76 U/L (ref 38–126)
ALT: 8 U/L — ABNORMAL LOW (ref 14–54)
AST: 15 U/L (ref 15–41)
Anion gap: 8 (ref 5–15)
BILIRUBIN TOTAL: 0.4 mg/dL (ref 0.3–1.2)
BUN: 15 mg/dL (ref 6–20)
CALCIUM: 8.7 mg/dL — AB (ref 8.9–10.3)
CO2: 23 mmol/L (ref 22–32)
Chloride: 107 mmol/L (ref 101–111)
Creatinine, Ser: 0.88 mg/dL (ref 0.44–1.00)
GFR calc Af Amer: >60 mL/min (ref >60)
GLUCOSE: 105 mg/dL — AB (ref 65–99)
POTASSIUM: 4.1 mmol/L (ref 3.5–5.1)
Sodium: 138 mmol/L (ref 135–145)
TOTAL PROTEIN: 6.2 g/dL — AB (ref 6.5–8.1)

## 2017-05-21 LAB — MAGNESIUM: MAGNESIUM: 2 mg/dL (ref 1.7–2.4)

## 2017-05-21 LAB — I-STAT CHEM 8, ED
BUN: 17 mg/dL (ref 6–20)
Calcium, Ion: 0.91 mmol/L — ABNORMAL LOW (ref 1.15–1.40)
Chloride: 113 mmol/L — ABNORMAL HIGH (ref 101–111)
Creatinine, Ser: 0.6 mg/dL (ref 0.44–1.00)
Glucose, Bld: 92 mg/dL (ref 65–99)
HEMATOCRIT: 20 % — AB (ref 36.0–46.0)
HEMOGLOBIN: 6.8 g/dL — AB (ref 12.0–15.0)
POTASSIUM: 4.6 mmol/L (ref 3.5–5.1)
Sodium: 141 mmol/L (ref 135–145)
TCO2: 19 mmol/L (ref 0–100)

## 2017-05-21 LAB — CBC WITH DIFFERENTIAL/PLATELET
BASOS ABS: 0 10*3/uL (ref 0.0–0.1)
BASOS PCT: 0 %
Eosinophils Absolute: 0.1 10*3/uL (ref 0.0–0.7)
Eosinophils Relative: 2 %
HEMATOCRIT: 24.6 % — AB (ref 36.0–46.0)
HEMOGLOBIN: 7.1 g/dL — AB (ref 12.0–15.0)
LYMPHS PCT: 34 %
Lymphs Abs: 1.8 10*3/uL (ref 0.7–4.0)
MCH: 22.5 pg — ABNORMAL LOW (ref 26.0–34.0)
MCHC: 28.9 g/dL — AB (ref 30.0–36.0)
MCV: 77.8 fL — AB (ref 78.0–100.0)
MONO ABS: 0.5 10*3/uL (ref 0.1–1.0)
MONOS PCT: 9 %
NEUTROS ABS: 3.1 10*3/uL (ref 1.7–7.7)
NEUTROS PCT: 55 %
Platelets: 454 10*3/uL — ABNORMAL HIGH (ref 150–400)
RBC: 3.16 MIL/uL — ABNORMAL LOW (ref 3.87–5.11)
RDW: 18.8 % — AB (ref 11.5–15.5)
WBC: 5.5 10*3/uL (ref 4.0–10.5)

## 2017-05-21 LAB — RETICULOCYTES
RBC.: 3.16 MIL/uL — ABNORMAL LOW (ref 3.87–5.11)
RETIC CT PCT: 1.5 % (ref 0.4–3.1)
Retic Count, Absolute: 47.4 10*3/uL (ref 19.0–186.0)

## 2017-05-21 MED ORDER — LACTATED RINGERS IV BOLUS (SEPSIS)
1000.0000 mL | Freq: Once | INTRAVENOUS | Status: AC
  Administered 2017-05-21: 1000 mL via INTRAVENOUS
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
## 2017-05-21 NOTE — ED Provider Notes (Signed)
[facility] DEPT Provider Note   CSN: [id number] Arrival date & time: 05/21/17  2058     History   Chief Complaint Chief Complaint  Patient presents with  . Loss of Consciousness    HPI Shirley Schneider is a 71 y.o. female.  HPI  71 year old female with past medical history of congestive heart failure, hypertension and pneumonia presents to the emergency department secondary to decreased alertness. Patient states she took an unknown amount of muscle relaxers and anxiety medication that she could relax. She was not trying to kill herself. Her family found her with sonorous respirations and called EMS. EMS reportedly gave Narcan and the patient did better and transferred here for evaluation. Here the patient has no complaints. She is not suicidal or homicidal. She states that she was just wanted to rest. No history of mental health problems. States the daughter.  Past Medical History:  Diagnosis Date  . Arthritis   . Asthma   . Congestive heart failure (CHF) ([place])   . Fibromyalgia   . Hypertension   . Migraines   . Pneumonia    In the 1980s    Patient Active Problem List   Diagnosis Date Noted  . Asthma 01/02/2017  . 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 RBBB 01/02/2017  . Obesity (BMI 30.0-34.9) 01/02/2017  . Hiatal hernia 07/01/2014  . Acute on chronic respiratory failure with hypoxia ([place]) 04/27/2014  . CAP (community acquired pneumonia) 04/27/2014  . Physical deconditioning 04/13/2014  . Acute respiratory failure with hypoxia ([place]) 04/05/2014  . Shock circulatory ([place]) 04/05/2014  . Congestive heart failure (CHF) ([place]) 04/04/2014  . PULMONARY INFILTRATES-PNEUMOPATHY 04/29/2008  . Essential hypertension 02/06/2008    Past Surgical History:  Procedure Laterality Date  . HERNIA REPAIR  1970    OB History    No data available       Home Medications    Prior to Admission medications   Medication Sig Start Date End Date Taking? Authorizing Provider    acetaminophen (TYLENOL) 500 MG tablet Take 1,000 mg by mouth every 6 (six) hours as needed.   Yes [provider]  albuterol (PROVENTIL HFA;VENTOLIN HFA) 108 (90 BASE) MCG/ACT inhaler Inhale 2 puffs into the lungs every 6 (six) hours as needed for wheezing. 07/28/14  Yes Brand Males, MD  albuterol (PROVENTIL) (2.5 MG/3ML) 0.083% nebulizer solution Take 2.5 mg by nebulization every 6 (six) hours as needed for wheezing or shortness of breath.   Yes [provider]  amLODipine (NORVASC) 5 MG tablet Take 5 mg by mouth daily. 12/07/16  Yes [provider]  budesonide-formoterol (SYMBICORT) 160-4.5 MCG/ACT inhaler Inhale 2 puffs into the lungs 2 (two) times daily.   Yes [provider]  butalbital-acetaminophen-caffeine (FIORICET, ESGIC) 50-325-40 MG per tablet Take 1 tablet by mouth 2 (two) times daily as needed for headache.  09/08/14  Yes [provider]  diclofenac sodium (VOLTAREN) 1 % GEL Apply 1 application topically 4 (four) times daily. Apply to knees   Yes [provider]  HYDROcodone-acetaminophen (NORCO) 10-325 MG tablet Take 1 tablet by mouth every 6 (six) hours as needed for moderate pain.  12/08/16  Yes [provider]  LORazepam (ATIVAN) 1 MG tablet Take 1 mg by mouth daily as needed for anxiety.  08/25/14  Yes [provider]  methocarbamol (ROBAXIN) 750 MG tablet Take 750 mg by mouth 2 (two) times daily as needed for muscle spasms. 01/22/17  Yes [provider]  omeprazole (PRILOSEC) 40 MG capsule  TAKE 1 CAPSULE (40 MG TOTAL) BY MOUTH 2 (TWO) TIMES DAILY. 03/02/17  Yes Milus Banister, MD  tiZANidine (ZANAFLEX) 4 MG tablet Take 4 mg by mouth at bedtime.   Yes [provider]  traZODone (DESYREL) 100 MG tablet Take 100 mg by mouth at bedtime as needed. for sleep 12/26/16  Yes [provider]  VESICARE 5 MG tablet Take 5 mg by mouth daily. 12/26/16  Yes [provider]  ondansetron  (ZOFRAN) 4 MG tablet Take 1 tablet (4 mg total) by mouth every 8 (eight) hours as needed for nausea or vomiting. 01/28/17   Tegeler, Gwenyth Allegra, MD    Family History Family History  Problem Relation Age of Onset  . Prostate cancer Father   . Diabetes Father   . Colon polyps Father   . Diabetes Mother   . Heart disease Maternal Grandfather   . Colon cancer Neg Hx   . Kidney disease Neg Hx   . Esophageal cancer Neg Hx     Social History Social History  Substance Use Topics  . Smoking status: Never Smoker  . Smokeless tobacco: Never Used  . Alcohol use No     Allergies   Aspirin   Review of Systems Review of Systems  Constitutional: Negative for fever.  All other systems reviewed and are negative.    Physical Exam Updated Vital Signs BP 124/76   Pulse 61   Temp 98.5 F (36.9 C) (Oral)   Resp 19   Ht 5\' 5"  (1.651 m)   Wt 86.2 kg (190 lb)   SpO2 100%   BMI 31.62 kg/m   Physical Exam  Constitutional: She appears well-developed and well-nourished.  HENT:  Head: Normocephalic and atraumatic.  Eyes: Conjunctivae and EOM are normal.  Neck: Normal range of motion.  Cardiovascular: Normal rate and regular rhythm.   Pulmonary/Chest: Effort normal and breath sounds normal. No stridor. No respiratory distress.  Abdominal: Soft. She exhibits no distension.  Musculoskeletal: Normal range of motion. She exhibits no edema or deformity.  Neurological: She is alert. She displays normal reflexes. No cranial nerve deficit. Coordination normal.  Oriented to place, situation and self but not time of year/week  Normal strength and sensation in face, upper and lower extremities.   Skin: Skin is warm and dry.  Nursing note and vitals reviewed.    ED Treatments / Results  Labs (all labs ordered are listed, but only abnormal results are displayed) Labs Reviewed  CBC WITH DIFFERENTIAL/PLATELET - Abnormal; Notable for the following:       Result Value   RBC 3.16 (*)     Hemoglobin 7.1 (*)    HCT 24.6 (*)    MCV 77.8 (*)    MCH 22.5 (*)    MCHC 28.9 (*)    RDW 18.8 (*)    Platelets 454 (*)    All other components within normal limits  COMPREHENSIVE METABOLIC PANEL - Abnormal; Notable for the following:    Glucose, Bld 105 (*)    Calcium 8.7 (*)    Total Protein 6.2 (*)    Albumin 3.3 (*)    ALT 8 (*)    All other components within normal limits  VITAMIN B12 - Abnormal; Notable for the following:    Vitamin B-12 176 (*)    All other components within normal limits  IRON AND TIBC - Abnormal; Notable for the following:    Iron 15 (*)    Saturation Ratios 4 (*)  All other components within normal limits  FERRITIN - Abnormal; Notable for the following:    Ferritin 5 (*)    All other components within normal limits  RETICULOCYTES - Abnormal; Notable for the following:    RBC. 3.16 (*)    All other components within normal limits  I-STAT CHEM 8, ED - Abnormal; Notable for the following:    Chloride 113 (*)    Calcium, Ion 0.91 (*)    Hemoglobin 6.8 (*)    HCT 20.0 (*)    All other components within normal limits  MAGNESIUM    EKG  EKG Interpretation None       Radiology No results found.  Procedures Procedures (including critical care time)  Medications Ordered in ED Medications  lactated ringers bolus 1,000 mL (0 mLs Intravenous Stopped 05/22/17 0038)     Initial Impression / Assessment and Plan / ED Course  I have reviewed the triage vital signs and the nursing notes.  Pertinent labs & imaging results that were available during my care of the patient were reviewed by me and considered in my medical decision making (see chart for details).     71 year old female with what sounds like an unintentional overdose. Patient was improved mental status here near baseline. We'll do screening i-STAT chem 8 and EKG if these are okay she can likely be discharged home. She needs better medication control which family will help with  it.   Final Clinical Impressions(s) / ED Diagnoses   Final diagnoses:  Accidental drug overdose, initial encounter     Merrily Pew, MD 05/22/17 1706

## 2017-05-22 LAB — IRON AND TIBC
IRON: 15 ug/dL — AB (ref 28–170)
Saturation Ratios: 4 % — ABNORMAL LOW (ref 10.4–31.8)
TIBC: 412 ug/dL (ref 250–450)
UIBC: 397 ug/dL

## 2017-05-22 LAB — VITAMIN B12: Vitamin B-12: 176 pg/mL — ABNORMAL LOW (ref 180–914)

## 2017-05-22 LAB — FERRITIN: FERRITIN: 5 ng/mL — AB (ref 11–307)

## 2017-05-22 NOTE — ED Notes (Signed)
Patient ambulated in hallway with no problems or complaints of dizziness.

## 2017-10-04 ENCOUNTER — Encounter (HOSPITAL_COMMUNITY): Payer: Self-pay

## 2017-10-04 ENCOUNTER — Emergency Department (HOSPITAL_COMMUNITY)
Admission: EM | Admit: 2017-10-04 | Discharge: 2017-10-06 | Disposition: E | Payer: Medicare HMO | Attending: Emergency Medicine | Admitting: Emergency Medicine

## 2017-10-04 DIAGNOSIS — I10 Essential (primary) hypertension: Secondary | ICD-10-CM | POA: Diagnosis not present

## 2017-10-04 DIAGNOSIS — I469 Cardiac arrest, cause unspecified: Secondary | ICD-10-CM | POA: Diagnosis not present

## 2017-10-04 DIAGNOSIS — J45909 Unspecified asthma, uncomplicated: Secondary | ICD-10-CM | POA: Diagnosis not present

## 2017-10-04 DIAGNOSIS — Z79899 Other long term (current) drug therapy: Secondary | ICD-10-CM | POA: Insufficient documentation

## 2017-10-04 LAB — I-STAT TROPONIN, ED: Troponin i, poc: 0.09 ng/mL (ref 0.00–0.08)

## 2017-10-04 LAB — CBG MONITORING, ED: Glucose-Capillary: <10 mg/dL — CL (ref 65–99)

## 2017-10-04 LAB — I-STAT CG4 LACTIC ACID, ED: LACTIC ACID, VENOUS: 16.46 mmol/L — AB (ref 0.5–1.9)

## 2017-10-04 MED ORDER — DEXTROSE 5 % IV SOLN
INTRAVENOUS | Status: AC | PRN
  Administered 2017-10-04: 150 mg via INTRAVENOUS
  Administered 2017-10-04: 300 mg via INTRAVENOUS

## 2017-10-04 MED ORDER — DEXTROSE 50 % IV SOLN
INTRAVENOUS | Status: AC | PRN
  Administered 2017-10-04: 50 mL via INTRAVENOUS

## 2017-10-04 MED ORDER — EPINEPHRINE PF 1 MG/10ML IJ SOSY
PREFILLED_SYRINGE | INTRAMUSCULAR | Status: AC | PRN
  Administered 2017-10-04 (×8): 1 mg via INTRAVENOUS

## 2017-10-04 MED ORDER — LIDOCAINE HCL (CARDIAC) 20 MG/ML IV SOLN
INTRAVENOUS | Status: AC | PRN
  Administered 2017-10-04: 100 mg via INTRAVENOUS

## 2017-10-04 MED ORDER — SODIUM BICARBONATE 8.4 % IV SOLN
INTRAVENOUS | Status: AC | PRN
  Administered 2017-10-04: 100 meq via INTRAVENOUS

## 2017-10-04 MED ORDER — SODIUM CHLORIDE 0.9 % IV SOLN
INTRAVENOUS | Status: AC | PRN
  Administered 2017-10-04 (×2): 1000 mL via INTRAVENOUS

## 2017-10-04 MED FILL — Medication: Qty: 1 | Status: AC

## 2017-10-06 NOTE — Progress Notes (Signed)
Supported family of pt who presented in ED as CPR in Progress.   Pt deceased. Provided emotional and Grief support to family. Faciliatated information sharing between staff and family.   11-02-2017 1405  Clinical Encounter Type  Visited With Patient;Family;Patient and family together;Health care provider  Visit Type Initial;Spiritual support;Death;ED;Patient actively dying;Trauma  Referral From Nurse  Spiritual Encounters  Spiritual Needs Prayer;Emotional;Grief support  Stress Factors  Family Stress Factors Exhausted;Loss  Cristopher [place], [facility], Pager [phone]

## 2017-10-06 NOTE — ED Triage Notes (Signed)
Pt presents via [facility] as cardiac arrest. Per [facility] pt had complaint of cough and sob x 1 month, was AxO x4 and ambulatory on EMS arrival. Pt had witnessed arrest in EMS truck at 1142 AM. Per EMS pt felt febrile, had L sided gaze and bradycardia prior to cardiac arrest. Pt given 5 epi in route and shocked x 2 for pulseless vfib.

## 2017-10-06 NOTE — ED Provider Notes (Signed)
[facility] EMERGENCY DEPARTMENT Provider Note  CSN: [id number] Arrival date & time: 10-26-2017 1210  Chief Complaint(s) Cardiac Arrest  HPI Shirley Schneider is a 71 y.o. female with a history of asthma, congestive heart failure, prior pneumonia presents as a CPR in progress patient.  EMS was called out to the patient's home for several weeks of cough and shortness of breath.  Upon EMS arrival the patient was alert oriented x4 and ambulatory.  When patient was transported into the ambulance, she was noted to be febrile.  Shortly thereafter the patient had a witnessed cardiac arrest.  EMS noted left-sided gaze, bradycardia just prior to the cardiac arrest.  Initial rhythm was noted to be PEA and patient was given a total of 5 rounds of epinephrine.  Subsequent rhythms were noted to be V. fib and patient required defibrillation x2.  Patient was intubated on scene.   Approximate time of ACLS was 25-30 minutes prior to arrival.  Remainder of history, ROS, and physical exam limited due to patient's condition (Acuity of situation). Additional information was obtained from EMS.   Level V Caveat.   HPI  Past Medical History Past Medical History:  Diagnosis Date  . Arthritis   . Asthma   . Congestive heart failure (CHF) ([place])   . Fibromyalgia   . Hypertension   . Migraines   . Pneumonia    In the 1980s   Patient Active Problem List   Diagnosis Date Noted  . Asthma 01/02/2017  . 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 RBBB 01/02/2017  . Obesity (BMI 30.0-34.9) 01/02/2017  . Hiatal hernia 07/01/2014  . Acute on chronic respiratory failure with hypoxia ([place]) 04/27/2014  . CAP (community acquired pneumonia) 04/27/2014  . Physical deconditioning 04/13/2014  . Acute respiratory failure with hypoxia ([place]) 04/05/2014  . Shock circulatory ([place]) 04/05/2014  . Congestive heart failure (CHF) ([place]) 04/04/2014  . PULMONARY INFILTRATES-PNEUMOPATHY 04/29/2008  . Essential  hypertension 02/06/2008   Home Medication(s) Prior to Admission medications   Medication Sig Start Date End Date Taking? Authorizing Provider  acetaminophen (TYLENOL) 500 MG tablet Take 1,000 mg by mouth every 6 (six) hours as needed.    [provider]  albuterol (PROVENTIL HFA;VENTOLIN HFA) 108 (90 BASE) MCG/ACT inhaler Inhale 2 puffs into the lungs every 6 (six) hours as needed for wheezing. 07/28/14   Brand Males, MD  albuterol (PROVENTIL) (2.5 MG/3ML) 0.083% nebulizer solution Take 2.5 mg by nebulization every 6 (six) hours as needed for wheezing or shortness of breath.    [provider]  amLODipine (NORVASC) 5 MG tablet Take 5 mg by mouth daily. 12/07/16   [provider]  budesonide-formoterol (SYMBICORT) 160-4.5 MCG/ACT inhaler Inhale 2 puffs into the lungs 2 (two) times daily.    [provider]  butalbital-acetaminophen-caffeine (FIORICET, ESGIC) 50-325-40 MG per tablet Take 1 tablet by mouth 2 (two) times daily as needed for headache.  09/08/14   [provider]  diclofenac sodium (VOLTAREN) 1 % GEL Apply 1 application topically 4 (four) times daily. Apply to knees    [provider]  HYDROcodone-acetaminophen (NORCO) 10-325 MG tablet Take 1 tablet by mouth every 6 (six) hours as needed for moderate pain.  12/08/16   [provider]  LORazepam (ATIVAN) 1 MG tablet Take 1 mg by mouth daily as needed for anxiety.  08/25/14   [provider]  methocarbamol (ROBAXIN) 750 MG tablet Take 750 mg by mouth 2 (two) times daily as needed for muscle  spasms. 01/22/17   [provider]  omeprazole (PRILOSEC) 40 MG capsule TAKE 1 CAPSULE (40 MG TOTAL) BY MOUTH 2 (TWO) TIMES DAILY. 03/02/17   Milus Banister, MD  ondansetron (ZOFRAN) 4 MG tablet Take 1 tablet (4 mg total) by mouth every 8 (eight) hours as needed for nausea or vomiting. 01/28/17   Tegeler, Gwenyth Allegra, MD  tiZANidine (ZANAFLEX) 4 MG tablet Take 4 mg by mouth  at bedtime.    [provider]  traZODone (DESYREL) 100 MG tablet Take 100 mg by mouth at bedtime as needed. for sleep 12/26/16   [provider]  VESICARE 5 MG tablet Take 5 mg by mouth daily. 12/26/16   [provider]                                                                                                                                    Past Surgical History Past Surgical History:  Procedure Laterality Date  . HERNIA REPAIR  1970   Family History Family History  Problem Relation Age of Onset  . Prostate cancer Father   . Diabetes Father   . Colon polyps Father   . Diabetes Mother   . Heart disease Maternal Grandfather   . Colon cancer Neg Hx   . Kidney disease Neg Hx   . Esophageal cancer Neg Hx     Social History Social History   Tobacco Use  . Smoking status: Never Smoker  . Smokeless tobacco: Never Used  Substance Use Topics  . Alcohol use: No  . Drug use: No   Allergies Aspirin  Review of Systems Review of Systems  Unable to perform ROS: Acuity of condition     Physical Exam Vital Signs  I have reviewed the triage vital signs Pulse (!) 0   Resp (!) 0   SpO2 (!) 0%   Physical Exam  Constitutional: She is oriented to person, place, and time. She appears well-developed and well-nourished. No distress.  HENT:  Head: Normocephalic and atraumatic.  Right Ear: External ear normal.  Left Ear: External ear normal.  Nose: Nose normal.  Eyes: No scleral icterus.  Cornea was hazy.  Pupils were dilated and fixed, not responsive to light  Neck: Normal range of motion and phonation normal.  Cardiovascular:  Pulseless  Pulmonary/Chest: Effort normal. No stridor. No respiratory distress.  Diminished breath sounds on the left  Abdominal: She exhibits no distension.  Musculoskeletal: Normal range of motion. She exhibits no edema.       Legs: Neurological: She is alert and oriented to person, place, and time.  Skin: She is not  diaphoretic.  Psychiatric: She has a normal mood and affect. Her behavior is normal.  Vitals reviewed.   ED Results and Treatments Labs (all labs ordered are listed, but only abnormal results are displayed) Labs Reviewed  CBG MONITORING, ED - Abnormal; Notable for the following components:  Result Value   Glucose-Capillary <10 (*)    All other components within normal limits  I-STAT CG4 LACTIC ACID, ED - Abnormal; Notable for the following components:   Lactic Acid, Venous 16.46 (*)    All other components within normal limits  I-STAT TROPONIN, ED - Abnormal; Notable for the following components:   Troponin i, poc 0.09 (*)    All other components within normal limits                                                                                                                         EKG  EKG Interpretation  Date/Time:    Ventricular Rate:    PR Interval:    QRS Duration:   QT Interval:    QTC Calculation:   R Axis:     Text Interpretation:        Radiology No results found. Pertinent labs & imaging results that were available during my care of the patient were reviewed by me and considered in my medical decision making (see chart for details).  Medications Ordered in ED Medications  0.9 %  sodium chloride infusion (1,000 mLs Intravenous New Bag/Given 2017-10-31 1230)  EPINEPHrine (ADRENALIN) 1 MG/10ML injection (1 mg Intravenous Given 10/31/17 1237)  amiodarone (CORDARONE) 150 mg in dextrose 5 % 100 mL bolus (150 mg Intravenous New Bag/Given 31-Oct-2017 1231)  lidocaine (cardiac) 100 mg/49ml (XYLOCAINE) 20 MG/ML injection 2% (100 mg Intravenous Given 10/31/17 1235)  sodium bicarbonate injection (100 mEq Intravenous Given 10/31/17 1212)  dextrose 50 % solution (50 mLs Intravenous Given October 31, 2017 1214)                                                                                                                                    Procedures Procedures CRITICAL  CARE Performed by: Grayce Sessions Mafalda Mcginniss Total critical care time: 30 minutes Critical care time was exclusive of separately billable procedures and treating other patients. Critical care was necessary to treat or prevent imminent or life-threatening deterioration. Critical care was time spent personally by me on the following activities: development of treatment plan with patient and/or surrogate as well as nursing, discussions with consultants, evaluation of patient's response to treatment, examination of patient, obtaining history from patient or surrogate, ordering and performing treatments and interventions, ordering and review of laboratory studies, ordering and review of radiographic studies, pulse oximetry and re-evaluation of patient's condition.  Cardiopulmonary Resuscitation (CPR) Procedure Note Directed/Performed by: Fatima Blank I personally directed ancillary staff and/or performed CPR in an effort to regain return of spontaneous circulation and to maintain cardiac, neuro and systemic perfusion.     EMERGENCY DEPARTMENT [place] CARDIAC EXAM "Study: Limited Ultrasound of the Heart and Pericardium"  INDICATIONS:Cardiac arrest Multiple views of the heart and pericardium were obtained in real-time with a multi-frequency probe.  PERFORMED ZD:GUYQIH IMAGES ARCHIVED?: No LIMITATIONS:  Emergent procedure VIEWS USED: Subcostal 4 chamber INTERPRETATION: Cardiac activity absent and Pericardial effusioin absent   (including critical care time)  Medical Decision Making / ED Course I have reviewed the nursing notes for this encounter and the patient's prior records (if available in EHR or on provided paperwork).    Cardiac arrest.  Initial rhythm was PEA.  Prior to arrival patient received 5 rounds of epinephrine and 2 rounds of defibrillation for pulseless V. Fib.  Patient was intubated on scene with 7.0 ET tube.  Breath sounds unequal with diminished left hemithorax breath  sounds, concerning for right mainstem.  ET tube was repositioned with improved breath sounds.  Initial pulse check in the emergency department was asystole.  ACLS was continued.  Patient received additional rounds of epinephrine, 1 dose of amiodarone, bicarb, D50.  Patient had subsequent rhythms of pulseless V. fib requiring several rounds of defibrillation.  Bedside ultrasound without cardiac activity.  No pericardial effusion.   ACLS was continued for another 30 minutes without return of spontaneous circulation.  Last several rhythms were asystole.  Patient was pronounced dead at 12:39 PM.  Family was updated.  Final Clinical Impression(s) / ED Diagnoses Final diagnoses:  Cardiac arrest [facility])     This chart was dictated using voice recognition software.  Despite best efforts to proofread,  errors can occur which can change the documentation meaning.   Fatima Blank, MD 2017/10/12 1323

## 2017-10-06 DEATH — deceased

## 2018-12-10 IMAGING — CR DG SHOULDER 2+V*R*
3 series · 3 of 3 positions shown · non-contrast
Comparison: Chest radiograph dated 10/20/2016

CLINICAL DATA: 70-year-old female with history of arthritis and
fibromyalgia presenting with right shoulder pain.

EXAM:
RIGHT SHOULDER - 2+ VIEW

[x shoulder ap right (1 of 2)]
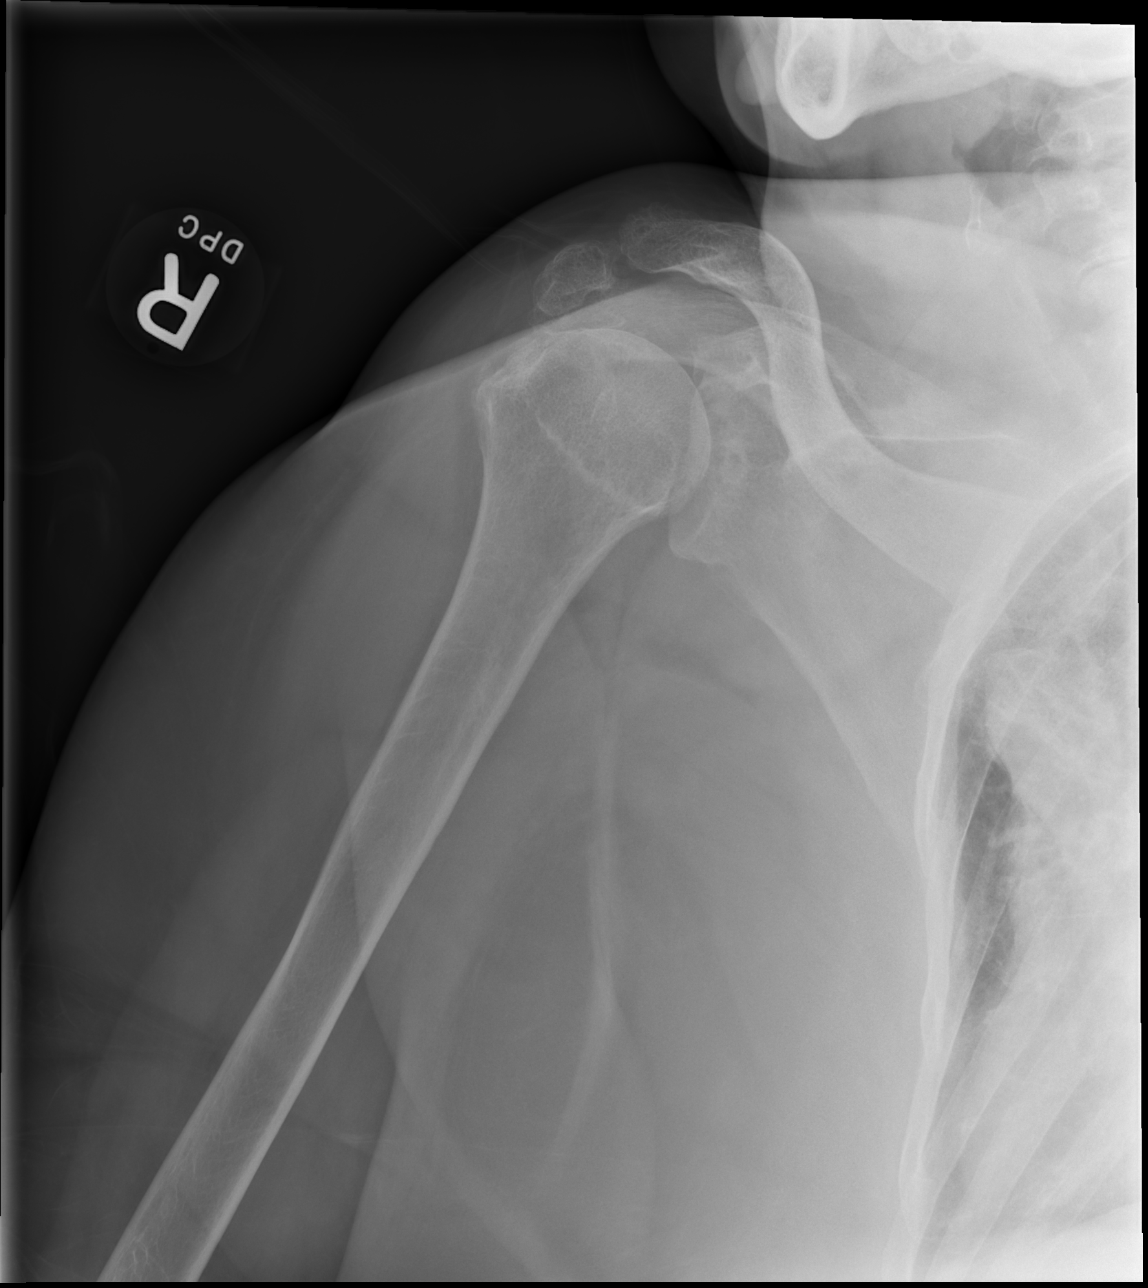

[x shoulder ap right (2 of 2)]
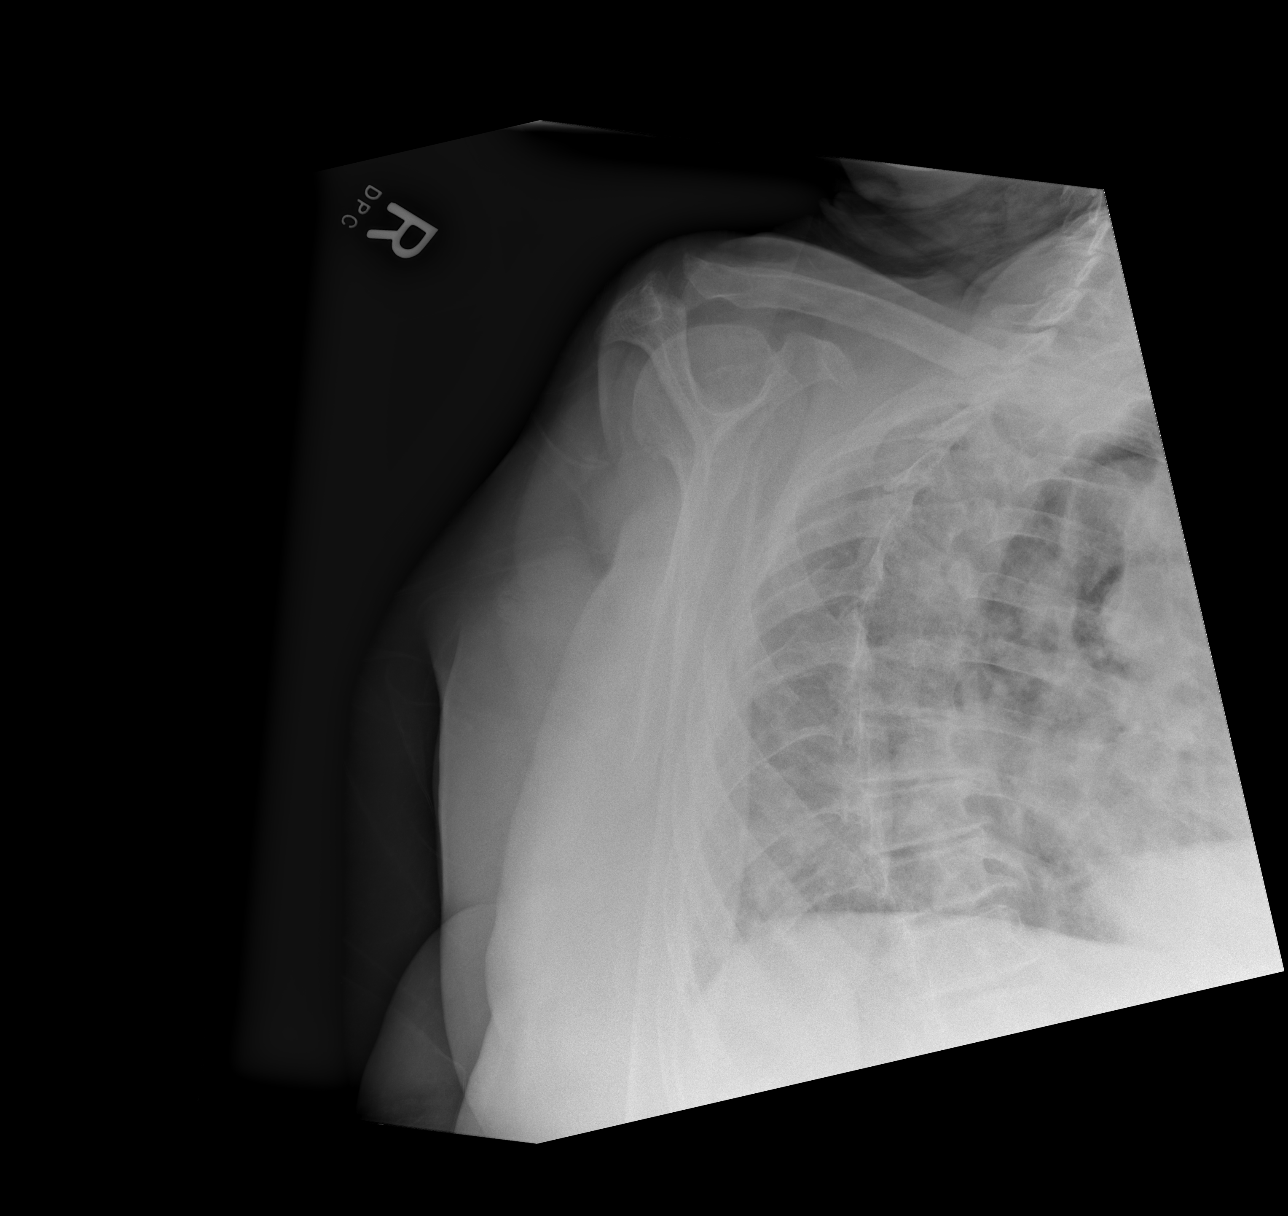

[x shoulder axillary right]
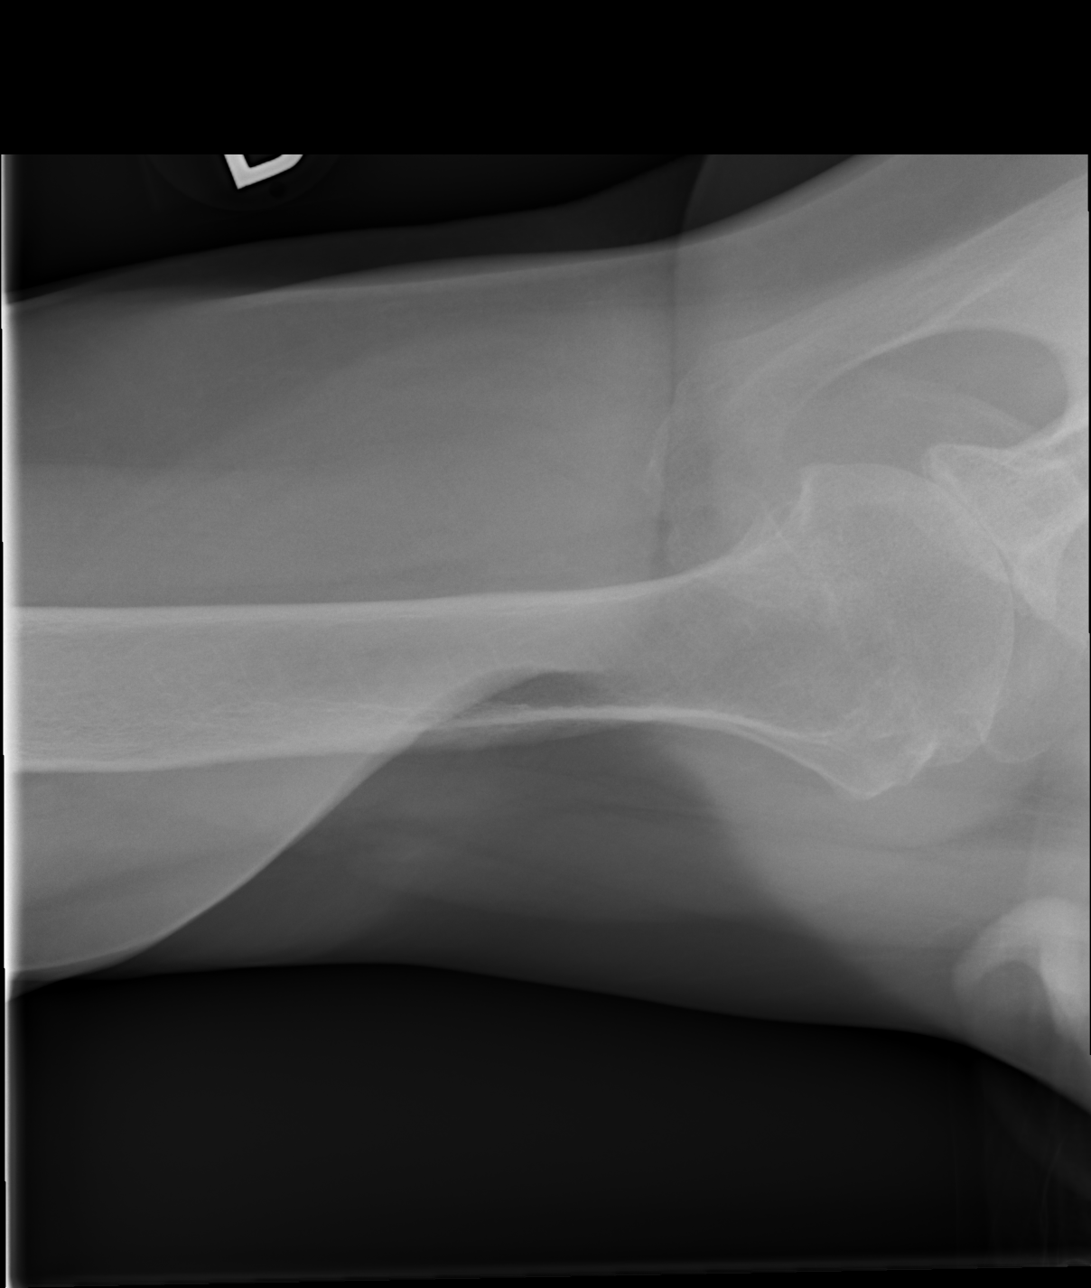

[3 of 3 positions shown; findings below may reference images not displayed]

FINDINGS: There is no acute fracture or dislocation. The bones are osteopenic.
There is moderate to severe osteoarthritic changes of the right
shoulder with irregularity of the bony glenoid and severe narrowing
of the glenohumeral joint space. There is degenerative changes and
widening of the AC joint. The soft tissues are unremarkable.

Right upper lobe airspace density is partially visualized.
IMPRESSION: No acute fracture or dislocation.

Moderate to severe right shoulder arthritis with severe narrowing of
the glenohumeral joint space.

Partially visualized right upper lobe opacity.
